# Patient Record
Sex: Female | Born: 1990 | Race: Asian | Hispanic: No | Marital: Married | Smoking: Never smoker
Health system: Southern US, Community
[De-identification: ages and names within clinical notes are randomized; demographics above are authoritative.]

## PROBLEM LIST (undated history)

## (undated) ENCOUNTER — Inpatient Hospital Stay (HOSPITAL_COMMUNITY): Payer: Self-pay

## (undated) DIAGNOSIS — O24419 Gestational diabetes mellitus in pregnancy, unspecified control: Secondary | ICD-10-CM

## (undated) DIAGNOSIS — N200 Calculus of kidney: Secondary | ICD-10-CM

## (undated) HISTORY — DX: Gestational diabetes mellitus in pregnancy, unspecified control: O24.419

## (undated) HISTORY — DX: Calculus of kidney: N20.0

## (undated) HISTORY — PX: NO PAST SURGERIES: SHX2092

---

## 2013-06-26 DIAGNOSIS — N200 Calculus of kidney: Secondary | ICD-10-CM

## 2013-06-26 HISTORY — DX: Calculus of kidney: N20.0

## 2015-05-03 LAB — OB RESULTS CONSOLE VARICELLA ZOSTER ANTIBODY, IGG: VARICELLA IGG: IMMUNE

## 2015-05-03 LAB — OB RESULTS CONSOLE GC/CHLAMYDIA
Chlamydia: NEGATIVE
Gonorrhea: NEGATIVE

## 2015-05-03 LAB — OB RESULTS CONSOLE HIV ANTIBODY (ROUTINE TESTING): HIV: NONREACTIVE

## 2015-05-03 LAB — OB RESULTS CONSOLE ABO/RH: RH TYPE: POSITIVE

## 2015-05-03 LAB — OB RESULTS CONSOLE RUBELLA ANTIBODY, IGM: Rubella: IMMUNE

## 2015-05-03 LAB — OB RESULTS CONSOLE HGB/HCT, BLOOD
HEMATOCRIT: 40 %
Hemoglobin: 12.5 g/dL

## 2015-05-03 LAB — OB RESULTS CONSOLE ANTIBODY SCREEN: ANTIBODY SCREEN: NEGATIVE

## 2015-05-03 LAB — OB RESULTS CONSOLE HEPATITIS B SURFACE ANTIGEN: Hepatitis B Surface Ag: NEGATIVE

## 2015-05-03 LAB — OB RESULTS CONSOLE RPR: RPR: NONREACTIVE

## 2015-08-13 ENCOUNTER — Encounter: Payer: Self-pay | Admitting: *Deleted

## 2015-08-13 DIAGNOSIS — N200 Calculus of kidney: Secondary | ICD-10-CM | POA: Insufficient documentation

## 2015-08-13 DIAGNOSIS — O0990 Supervision of high risk pregnancy, unspecified, unspecified trimester: Secondary | ICD-10-CM | POA: Insufficient documentation

## 2015-08-13 DIAGNOSIS — O24419 Gestational diabetes mellitus in pregnancy, unspecified control: Secondary | ICD-10-CM | POA: Insufficient documentation

## 2015-08-13 LAB — CYTOLOGY - PAP: PAP SMEAR: NEGATIVE

## 2015-08-16 ENCOUNTER — Encounter: Payer: Self-pay | Admitting: Obstetrics and Gynecology

## 2015-08-17 ENCOUNTER — Encounter (HOSPITAL_COMMUNITY): Payer: Self-pay

## 2015-08-17 ENCOUNTER — Other Ambulatory Visit (HOSPITAL_COMMUNITY): Payer: Self-pay

## 2015-09-06 ENCOUNTER — Encounter: Payer: Self-pay | Admitting: Family

## 2015-09-06 ENCOUNTER — Ambulatory Visit (INDEPENDENT_AMBULATORY_CARE_PROVIDER_SITE_OTHER): Payer: Medicaid Other | Admitting: Family

## 2015-09-06 ENCOUNTER — Encounter: Payer: Medicaid Other | Attending: Obstetrics and Gynecology | Admitting: *Deleted

## 2015-09-06 VITALS — BP 113/69 | HR 74 | Temp 98.5°F | Ht 60.5 in | Wt 148.0 lb

## 2015-09-06 DIAGNOSIS — O24419 Gestational diabetes mellitus in pregnancy, unspecified control: Secondary | ICD-10-CM

## 2015-09-06 DIAGNOSIS — Z029 Encounter for administrative examinations, unspecified: Secondary | ICD-10-CM | POA: Diagnosis not present

## 2015-09-06 DIAGNOSIS — Z23 Encounter for immunization: Secondary | ICD-10-CM

## 2015-09-06 DIAGNOSIS — O0993 Supervision of high risk pregnancy, unspecified, third trimester: Secondary | ICD-10-CM

## 2015-09-06 DIAGNOSIS — O24913 Unspecified diabetes mellitus in pregnancy, third trimester: Secondary | ICD-10-CM

## 2015-09-06 DIAGNOSIS — O24410 Gestational diabetes mellitus in pregnancy, diet controlled: Secondary | ICD-10-CM | POA: Diagnosis not present

## 2015-09-06 DIAGNOSIS — O09893 Supervision of other high risk pregnancies, third trimester: Secondary | ICD-10-CM

## 2015-09-06 LAB — POCT URINALYSIS DIP (DEVICE)
Bilirubin Urine: NEGATIVE
Glucose, UA: NEGATIVE mg/dL
HGB URINE DIPSTICK: NEGATIVE
KETONES UR: NEGATIVE mg/dL
Leukocytes, UA: NEGATIVE
Nitrite: NEGATIVE
PH: 7 (ref 5.0–8.0)
PROTEIN: NEGATIVE mg/dL
SPECIFIC GRAVITY, URINE: 1.02 (ref 1.005–1.030)
UROBILINOGEN UA: 0.2 mg/dL (ref 0.0–1.0)

## 2015-09-06 MED ORDER — GLUCOSE BLOOD VI STRP: ORAL_STRIP | Status: DC

## 2015-09-06 MED ORDER — ACCU-CHEK SOFTCLIX LANCET DEV MISC: Status: DC

## 2015-09-06 NOTE — Patient Instructions (Addendum)
WHOLE GRAIN   Third Trimester of Pregnancy The third trimester is from week 29 through week 42, months 7 through 9. The third trimester is a time when the fetus is growing rapidly. At the end of the ninth month, the fetus is about 20 inches in length and weighs 6-10 pounds.  BODY CHANGES Your body goes through many changes during pregnancy. The changes vary from woman to woman.   Your weight will continue to increase. You can expect to gain 25-35 pounds (11-16 kg) by the end of the pregnancy.  You may begin to get stretch marks on your hips, abdomen, and breasts.  You may urinate more often because the fetus is moving lower into your pelvis and pressing on your bladder.  You may develop or continue to have heartburn as a result of your pregnancy.  You may develop constipation because certain hormones are causing the muscles that push waste through your intestines to slow down.  You may develop hemorrhoids or swollen, bulging veins (varicose veins).  You may have pelvic pain because of the weight gain and pregnancy hormones relaxing your joints between the bones in your pelvis. Backaches may result from overexertion of the muscles supporting your posture.  You may have changes in your hair. These can include thickening of your hair, rapid growth, and changes in texture. Some women also have hair loss during or after pregnancy, or hair that feels dry or thin. Your hair will most likely return to normal after your baby is born.  Your breasts will continue to grow and be tender. A yellow discharge may leak from your breasts called colostrum.  Your belly button may stick out.  You may feel short of breath because of your expanding uterus.  You may notice the fetus "dropping," or moving lower in your abdomen.  You may have a bloody mucus discharge. This usually occurs a few days to a week before labor begins.  Your cervix becomes thin and soft (effaced) near your due date. WHAT TO EXPECT  AT YOUR PRENATAL EXAMS  You will have prenatal exams every 2 weeks until week 36. Then, you will have weekly prenatal exams. During a routine prenatal visit:  You will be weighed to make sure you and the fetus are growing normally.  Your blood pressure is taken.  Your abdomen will be measured to track your baby's growth.  The fetal heartbeat will be listened to.  Any test results from the previous visit will be discussed.  You may have a cervical check near your due date to see if you have effaced. At around 36 weeks, your caregiver will check your cervix. At the same time, your caregiver will also perform a test on the secretions of the vaginal tissue. This test is to determine if a type of bacteria, Group B streptococcus, is present. Your caregiver will explain this further. Your caregiver may ask you:  What your birth plan is.  How you are feeling.  If you are feeling the baby move.  If you have had any abnormal symptoms, such as leaking fluid, bleeding, severe headaches, or abdominal cramping.  If you are using any tobacco products, including cigarettes, chewing tobacco, and electronic cigarettes.  If you have any questions. Other tests or screenings that may be performed during your third trimester include:  Blood tests that check for low iron levels (anemia).  Fetal testing to check the health, activity level, and growth of the fetus. Testing is done if you have certain  medical conditions or if there are problems during the pregnancy.  HIV (human immunodeficiency virus) testing. If you are at high risk, you may be screened for HIV during your third trimester of pregnancy. FALSE LABOR You may feel small, irregular contractions that eventually go away. These are called Braxton Hicks contractions, or false labor. Contractions may last for hours, days, or even weeks before true labor sets in. If contractions come at regular intervals, intensify, or become painful, it is best to  be seen by your caregiver.  SIGNS OF LABOR   Menstrual-like cramps.  Contractions that are 5 minutes apart or less.  Contractions that start on the top of the uterus and spread down to the lower abdomen and back.  A sense of increased pelvic pressure or back pain.  A watery or bloody mucus discharge that comes from the vagina. If you have any of these signs before the 37th week of pregnancy, call your caregiver right away. You need to go to the hospital to get checked immediately. HOME CARE INSTRUCTIONS   Avoid all smoking, herbs, alcohol, and unprescribed drugs. These chemicals affect the formation and growth of the baby.  Do not use any tobacco products, including cigarettes, chewing tobacco, and electronic cigarettes. If you need help quitting, ask your health care provider. You may receive counseling support and other resources to help you quit.  Follow your caregiver's instructions regarding medicine use. There are medicines that are either safe or unsafe to take during pregnancy.  Exercise only as directed by your caregiver. Experiencing uterine cramps is a good sign to stop exercising.  Continue to eat regular, healthy meals.  Wear a good support bra for breast tenderness.  Do not use hot tubs, steam rooms, or saunas.  Wear your seat belt at all times when driving.  Avoid raw meat, uncooked cheese, cat litter boxes, and soil used by cats. These carry germs that can cause birth defects in the baby.  Take your prenatal vitamins.  Take 1500-2000 mg of calcium daily starting at the 20th week of pregnancy until you deliver your baby.  Try taking a stool softener (if your caregiver approves) if you develop constipation. Eat more high-fiber foods, such as fresh vegetables or fruit and whole grains. Drink plenty of fluids to keep your urine clear or pale yellow.  Take warm sitz baths to soothe any pain or discomfort caused by hemorrhoids. Use hemorrhoid cream if your caregiver  approves.  If you develop varicose veins, wear support hose. Elevate your feet for 15 minutes, 3-4 times a day. Limit salt in your diet.  Avoid heavy lifting, wear low heal shoes, and practice good posture.  Rest a lot with your legs elevated if you have leg cramps or low back pain.  Visit your dentist if you have not gone during your pregnancy. Use a soft toothbrush to brush your teeth and be gentle when you floss.  A sexual relationship may be continued unless your caregiver directs you otherwise.  Do not travel far distances unless it is absolutely necessary and only with the approval of your caregiver.  Take prenatal classes to understand, practice, and ask questions about the labor and delivery.  Make a trial run to the hospital.  Pack your hospital bag.  Prepare the baby's nursery.  Continue to go to all your prenatal visits as directed by your caregiver. SEEK MEDICAL CARE IF:  You are unsure if you are in labor or if your water has broken.  You have  dizziness.  You have mild pelvic cramps, pelvic pressure, or nagging pain in your abdominal area.  You have persistent nausea, vomiting, or diarrhea.  You have a bad smelling vaginal discharge.  You have pain with urination. SEEK IMMEDIATE MEDICAL CARE IF:   You have a fever.  You are leaking fluid from your vagina.  You have spotting or bleeding from your vagina.  You have severe abdominal cramping or pain.  You have rapid weight loss or gain.  You have shortness of breath with chest pain.  You notice sudden or extreme swelling of your face, hands, ankles, feet, or legs.  You have not felt your baby move in over an hour.  You have severe headaches that do not go away with medicine.  You have vision changes.   This information is not intended to replace advice given to you by your health care provider. Make sure you discuss any questions you have with your health care provider.   Document Released:  06/06/2001 Document Revised: 07/03/2014 Document Reviewed: 08/13/2012 Elsevier Interactive Patient Education [employer] Elsevier Inc.

## 2015-09-06 NOTE — Progress Notes (Signed)
U/S scheduled for 03/20 @ 8am

## 2015-09-06 NOTE — Progress Notes (Signed)
   Patient was seen on 09/06/15 for Gestational Diabetes self-management . The following learning objectives were met by the patient :   States the definition of Gestational Diabetes  States when to check blood glucose levels  Demonstrates proper blood glucose monitoring techniques  States the effect of stress and exercise on blood glucose levels  States the importance of limiting caffeine and abstaining from alcohol and smoking  Plan:  Consider  increasing your activity level by walking daily as tolerated Begin checking BG before breakfast and 2 hours after first bit of breakfast, lunch and dinner after  as directed by MD  Take medication  as directed by MD  Blood glucose monitor given: Accu-chek Aviva Plus Lot # [id number] Exp: 06-26-15  Patient instructed to monitor glucose levels: FBS: 60 - <90 2 hour: <120  Patient received the following handouts:  Nutrition Diabetes and Pregnancy  Patient will be seen for follow-up in one week.

## 2015-09-06 NOTE — Progress Notes (Signed)
Nutrition note: 1st visit consult & GDM diet education Pt was recently diagnosed with GDM.  Pt has gained 15# @ 31w, which is wnl. Pt reports eating 3-5x/d. Pt is taking a PNV. Pt reports no N&V or heartburn. NKFA. Pt reports no walking or physical activity. Pt received verbal & written education on GDM in [place] via a video-interpreter. Encouraged ~30 mins of walking most days of the week. Discussed wt gain goals of 15-25# or 0.6#/wk. Pt agrees to follow GDM diet with 3 meals & 1-3 snacks/d with proper CHO/ protein combination. Pt has WIC & plans to BF. F/u in 2-4 wks Blondell RevealLaura Shams Fill, MS, RD, LDN, [facility]

## 2015-09-06 NOTE — Progress Notes (Signed)
    Subjective:    Kristi [name] is a G1P0000 5231w0d being seen today for her first obstetrical visit.  Pt is a transfer from the health department.    Her obstetrical history is significant for gestational diabetes. Patient does intend to breast feed. Pregnancy history fully reviewed.  Patient reports no complaints.  Filed Vitals:   09/06/15 1002 09/06/15 1014  BP: 113/69   Pulse: 74   Temp: 98.5 F (36.9 C)   Height:  5\' 2"  (1.575 m)  Weight: 148 lb (67.132 kg)     HISTORY: OB History  Gravida Para Term Preterm AB SAB TAB Ectopic Multiple Living  1 0 0 0 0 0 0 0 0 0     # Outcome Date GA Lbr Len/2nd Weight Sex Delivery Anes PTL Lv  1 Current              Past Medical History  Diagnosis Date  . Kidney stones 2015  . Gestational diabetes    Past Surgical History  Procedure Laterality Date  . No past surgeries     Family History  Problem Relation Age of Onset  . Heart disease Mother   . Diabetes Mother   . Cancer Mother     stomach  . Hypertension Father      Exam    Uterus:  Fundal Height: 32 cm   Filed Vitals:   09/06/15 1002 09/06/15 1014  BP: 113/69   Pulse: 74   Temp: 98.5 F (36.9 C)   Height:  5\' 2"  (1.575 m)  Weight: 148 lb (67.132 kg)     Fetal Status: Fetal Heart Rate (bpm): 135 Fundal Height: 32 cm Movement: Present     General:  Alert, oriented and cooperative. Patient is in no acute distress.  Skin: Skin is warm and dry. No rash noted.   Cardiovascular: Normal heart rate noted  Respiratory: Normal respiratory effort, no problems with respiration noted  Abdomen: Soft, gravid, appropriate for gestational age. Pain/Pressure: Absent     Pelvic:       Cervical exam deferred        Extremities: Normal range of motion.  Edema: None  Mental Status: Normal mood and affect. Normal behavior. Normal judgment and thought content.   Urinalysis:    Protein neg Glucose neg     Assessment:    Pregnancy: G1P0000 Patient Active Problem List   Diagnosis Date Noted  . Supervision of high risk pregnancy, antepartum 08/13/2015  . Gestational diabetes 08/13/2015  . Kidney stones         Plan:     Reviewed prenatal records. Prenatal vitamins. Problem list reviewed and updated. Discussed implications of DM in pregnancy, need for optimizing glycemic control to decrease DM associated maternal-fetal morbidity and mortality, need for antenatal testing and frequent ultrasounds/prenatal visits. Will check baseline labs today. DM education and DM testing supplies today. Genetic Screening:  Quad negative  Ultrasound discussed; fetal survey: ordered for growth.  Follow up in 2 weeks. Video interpreter:  Phylliss Bobruyen #[id number]  Marlis EdelsonKARIM, WALIDAH N 09/06/2015

## 2015-09-07 ENCOUNTER — Telehealth: Payer: Self-pay | Admitting: *Deleted

## 2015-09-07 NOTE — Telephone Encounter (Signed)
[facility] requires a prior authorization for accu-check aviva plus test strips. Per chart review has medicaid and GDM.    Called [facility] twice to speak with pharmacist- but did not get an answer- kept getting bumped back to triage line.

## 2015-09-07 NOTE — Telephone Encounter (Signed)
Kristi [name] called this afternoon and left a message [facility] and she got the machine and she has checked it many times and it is not working.   I called [employer] and she states it is still not working , I asked her to come to clinic tomorrow so we can check it with her. She states she has an appointment Monday. I informed her we do not want her to wait until Monday- we want her to be able to check her sugars. She states she is working but can come at [employer]3pm.

## 2015-09-08 ENCOUNTER — Ambulatory Visit: Payer: Medicaid Other | Admitting: *Deleted

## 2015-09-08 DIAGNOSIS — O24419 Gestational diabetes mellitus in pregnancy, unspecified control: Secondary | ICD-10-CM

## 2015-09-08 LAB — CULTURE, OB URINE
Colony Count: NO GROWTH
Organism ID, Bacteria: NO GROWTH

## 2015-09-08 NOTE — Telephone Encounter (Signed)
Pt here today, 09/08/15, concern addressed.

## 2015-09-08 NOTE — Progress Notes (Signed)
Kristi [name] came in for help with her glucose meter. I had spoke with her yesterday and asked her to come in to see if we could help her. She states since she left Monday she has tried at least 10 times but meter not working.  I had her check her blood sugar and I helped her collect the blood with her finger- she states " oh, I was trying to touch it from the top".CBG was 92,  I had to repeat collecting her blood and on the second time she did it correctly and states she understands to make sure she has a good drop of blood and touch the blood drop to the end of the strip. She also reviewed when and how often she should be collecting her blood (fasting and 2 hours after all meals).  We recorded her result in her logbook and she states she understands to continue at home, bring book and meter Monday to her next appt. She was worried about missing some results, I explained she could tell the doctor about the meter problems.

## 2015-09-10 ENCOUNTER — Encounter (HOSPITAL_COMMUNITY): Payer: Self-pay

## 2015-09-13 ENCOUNTER — Other Ambulatory Visit: Payer: Self-pay | Admitting: Family

## 2015-09-13 ENCOUNTER — Encounter: Payer: Medicaid Other | Admitting: *Deleted

## 2015-09-13 ENCOUNTER — Encounter (HOSPITAL_COMMUNITY): Payer: Self-pay

## 2015-09-13 ENCOUNTER — Ambulatory Visit: Payer: Medicaid Other | Admitting: *Deleted

## 2015-09-13 ENCOUNTER — Ambulatory Visit (HOSPITAL_COMMUNITY)
Admission: RE | Admit: 2015-09-13 | Discharge: 2015-09-13 | Disposition: A | Discharge status: Home / Self Care | Payer: Medicaid Other | Source: Ambulatory Visit | Attending: Family | Admitting: Family

## 2015-09-13 VITALS — BP 107/74 | HR 84 | Wt 149.6 lb

## 2015-09-13 DIAGNOSIS — O24419 Gestational diabetes mellitus in pregnancy, unspecified control: Secondary | ICD-10-CM

## 2015-09-13 DIAGNOSIS — O24410 Gestational diabetes mellitus in pregnancy, diet controlled: Secondary | ICD-10-CM | POA: Insufficient documentation

## 2015-09-13 DIAGNOSIS — Z3A32 32 weeks gestation of pregnancy: Secondary | ICD-10-CM | POA: Diagnosis not present

## 2015-09-13 DIAGNOSIS — Z3689 Encounter for other specified antenatal screening: Secondary | ICD-10-CM

## 2015-09-13 DIAGNOSIS — O0990 Supervision of high risk pregnancy, unspecified, unspecified trimester: Secondary | ICD-10-CM

## 2015-09-13 DIAGNOSIS — Z36 Encounter for antenatal screening of mother: Secondary | ICD-10-CM | POA: Insufficient documentation

## 2015-09-13 DIAGNOSIS — Z029 Encounter for administrative examinations, unspecified: Secondary | ICD-10-CM | POA: Diagnosis not present

## 2015-09-13 NOTE — Progress Notes (Signed)
Pt presents for review of glucose readings. FBS 80-92, whpp breakfast 79-94, 2hpp lunch 81-93, 2hpp dinner 80-119. Number validated in meter. Readings WNL no modification needed. Patient will be seen by MD in 1 week.

## 2015-09-14 ENCOUNTER — Other Ambulatory Visit (HOSPITAL_COMMUNITY): Payer: Self-pay

## 2015-09-20 ENCOUNTER — Ambulatory Visit (INDEPENDENT_AMBULATORY_CARE_PROVIDER_SITE_OTHER): Payer: Medicaid Other | Admitting: Obstetrics & Gynecology

## 2015-09-20 VITALS — BP 117/59 | HR 78 | Temp 97.7°F | Wt 148.2 lb

## 2015-09-20 DIAGNOSIS — O24410 Gestational diabetes mellitus in pregnancy, diet controlled: Secondary | ICD-10-CM | POA: Diagnosis present

## 2015-09-20 DIAGNOSIS — O0993 Supervision of high risk pregnancy, unspecified, third trimester: Secondary | ICD-10-CM

## 2015-09-20 LAB — POCT URINALYSIS DIP (DEVICE)
BILIRUBIN URINE: NEGATIVE
Bilirubin Urine: NEGATIVE
GLUCOSE, UA: NEGATIVE mg/dL
Glucose, UA: NEGATIVE mg/dL
HGB URINE DIPSTICK: NEGATIVE
Hgb urine dipstick: NEGATIVE
KETONES UR: NEGATIVE mg/dL
KETONES UR: NEGATIVE mg/dL
Nitrite: NEGATIVE
Nitrite: NEGATIVE
PH: 6 (ref 5.0–8.0)
PROTEIN: NEGATIVE mg/dL
PROTEIN: NEGATIVE mg/dL
Urobilinogen, UA: 0.2 mg/dL (ref 0.0–1.0)
Urobilinogen, UA: 0.2 mg/dL (ref 0.0–1.0)
pH: 6 (ref 5.0–8.0)

## 2015-09-20 LAB — CBC
HEMATOCRIT: 37.1 % (ref 36.0–46.0)
HEMOGLOBIN: 11.8 g/dL — AB (ref 12.0–15.0)
MCH: 21.3 pg — ABNORMAL LOW (ref 26.0–34.0)
MCHC: 31.8 g/dL (ref 30.0–36.0)
MCV: 67.1 fL — AB (ref 78.0–100.0)
Platelets: 189 10*3/uL (ref 150–400)
RBC: 5.53 MIL/uL — ABNORMAL HIGH (ref 3.87–5.11)
RDW: 15.4 % (ref 11.5–15.5)
WBC: 8.9 10*3/uL (ref 4.0–10.5)

## 2015-09-20 LAB — HIV ANTIBODY (ROUTINE TESTING W REFLEX): HIV 1&2 Ab, 4th Generation: NONREACTIVE

## 2015-09-20 NOTE — Patient Instructions (Signed)

## 2015-09-20 NOTE — Progress Notes (Signed)
Breastfeeding tip of the week reviewed 28 week labs today

## 2015-09-20 NOTE — Progress Notes (Signed)
Subjective:  Kristi [name] is a 25 y.o. G1P0000 at 4970w0d being seen today for ongoing prenatal care.  She is currently monitored for the following issues for this high-risk pregnancy and has Kidney stones; Supervision of high risk pregnancy, antepartum; and Gestational diabetes on her problem list.  Patient reports rash and itch on abdomen.  Contractions: Not present. Vag. Bleeding: None.  Movement: Present. Denies leaking of fluid.   The following portions of the patient's history were reviewed and updated as appropriate: allergies, current medications, past family history, past medical history, past social history, past surgical history and problem list. Problem list updated.  Objective:   Filed Vitals:   09/20/15 0952  BP: 117/59  Pulse: 78  Temp: 97.7 F (36.5 C)  Weight: 67.223 kg (148 lb 3.2 oz)    Fetal Status: Fetal Heart Rate (bpm): 133   Movement: Present     General:  Alert, oriented and cooperative. Patient is in no acute distress.  Skin: Skin is warm and dry. No rash noted.   Cardiovascular: Normal heart rate noted  Respiratory: Normal respiratory effort, no problems with respiration noted  Abdomen: Soft, gravid, appropriate for gestational age. Pain/Pressure: Present     Pelvic: Vag. Bleeding: None Vag D/C Character: White   Cervical exam deferred        Extremities: Normal range of motion.  Edema: None  Mental Status: Normal mood and affect. Normal behavior. Normal judgment and thought content.   Urinalysis:      Assessment and Plan:  Pregnancy: G1P0000 at 470w0d  1. Supervision of high risk pregnancy, antepartum, third trimester PUPPP with rash on abdomen, may use benadryl - CBC - HIV antibody (with reflex) - RPR  2. Diet controlled gestational diabetes mellitus in third trimester Excellent diet control. Nl anatomy scan 1 week ago  Preterm labor symptoms and general obstetric precautions including but not limited to vaginal bleeding, contractions, leaking of  fluid and fetal movement were reviewed in detail with the patient. Please refer to After Visit Summary for other counseling recommendations.  F/u 2 weeks  Adam PhenixJames G Mahmood Boehringer, MD

## 2015-09-21 LAB — RPR

## 2015-10-04 ENCOUNTER — Ambulatory Visit (INDEPENDENT_AMBULATORY_CARE_PROVIDER_SITE_OTHER): Payer: Medicaid Other | Admitting: Obstetrics & Gynecology

## 2015-10-04 VITALS — BP 111/65 | HR 66 | Wt 151.2 lb

## 2015-10-04 DIAGNOSIS — O0993 Supervision of high risk pregnancy, unspecified, third trimester: Secondary | ICD-10-CM

## 2015-10-04 DIAGNOSIS — O24410 Gestational diabetes mellitus in pregnancy, diet controlled: Secondary | ICD-10-CM | POA: Diagnosis not present

## 2015-10-04 LAB — POCT URINALYSIS DIP (DEVICE)
Bilirubin Urine: NEGATIVE
GLUCOSE, UA: NEGATIVE mg/dL
Ketones, ur: NEGATIVE mg/dL
Nitrite: NEGATIVE
PH: 7 (ref 5.0–8.0)
Protein, ur: NEGATIVE mg/dL
SPECIFIC GRAVITY, URINE: 1.02 (ref 1.005–1.030)
UROBILINOGEN UA: 0.2 mg/dL (ref 0.0–1.0)

## 2015-10-04 MED ORDER — ACCU-CHEK SOFTCLIX LANCET DEV MISC: Status: DC

## 2015-10-04 MED ORDER — GLUCOSE BLOOD VI STRP: ORAL_STRIP | Status: DC

## 2015-10-04 NOTE — Progress Notes (Signed)
Used interpreter number [id number]. Pt out of blood testing supplies.

## 2015-10-04 NOTE — Patient Instructions (Signed)
Return to clinic for any scheduled appointments or obstetric concerns, or go to [facility] for evaluation  

## 2015-10-04 NOTE — Progress Notes (Signed)
Subjective:  Kristi [name] is a 25 y.o. G1P0000 at 7078w0d being seen today for ongoing prenatal care.  She is currently monitored for the following issues for this high-risk pregnancy and has Kidney stones; Supervision of high risk pregnancy, antepartum; and Gestational diabetes on her problem list. [employer] phone interpreter used.  Patient reports no complaints.  Contractions: Not present. Vag. Bleeding: None.  Movement: Present. Denies leaking of fluid.   The following portions of the patient's history were reviewed and updated as appropriate: allergies, current medications, past family history, past medical history, past social history, past surgical history and problem list. Problem list updated.  Objective:   Filed Vitals:   10/04/15 0945  BP: 111/65  Pulse: 66  Weight: 151 lb 3.2 oz (68.584 kg)    Fetal Status: Fetal Heart Rate (bpm): 135 Fundal Height: 35 cm Movement: Present     General:  Alert, oriented and cooperative. Patient is in no acute distress.  Skin: Skin is warm and dry. No rash noted.   Cardiovascular: Normal heart rate noted  Respiratory: Normal respiratory effort, no problems with respiration noted  Abdomen: Soft, gravid, appropriate for gestational age. Pain/Pressure: Absent     Pelvic: Vag. Bleeding: None    Cervical exam deferred        Extremities: Normal range of motion.  Edema: None  Mental Status: Normal mood and affect. Normal behavior. Normal judgment and thought content.   Urinalysis: Urine Protein: Negative Urine Glucose: Negative CBGs within control Assessment and Plan:  Pregnancy: G1P0000 at 3178w0d  1. Diet controlled gestational diabetes mellitus in third trimester Continue diet control. Lancets and strips refilled. 38 week growth scan ordered. - glucose blood (ACCU-CHEK AVIVA PLUS) test strip; New DX GDM O24.419 for testing 4 times daily  Dispense: 100 each; Refill: 12 - Lancet Devices (ACCU-CHEK SOFTCLIX) lancets; New DX GDM O24.419 for testing 4  times daily  Dispense: 100 each; Refill: 10 - US MFM OB FOLLOW UP; Future  2. Supervision of high risk pregnancy, antepartum, third trimester Preterm labor symptoms and general obstetric precautions including but not limited to vaginal bleeding, contractions, leaking of fluid and fetal movement were reviewed in detail with the patient. Please refer to After Visit Summary for other counseling recommendations.  Return in about 1 week (around 10/11/2015) for OB Visit, Pelvic cultures.   Tereso NewcomerUgonna A Bethany Cumming, MD

## 2015-10-11 ENCOUNTER — Other Ambulatory Visit (HOSPITAL_COMMUNITY)
Admission: RE | Admit: 2015-10-11 | Discharge: 2015-10-11 | Disposition: A | Discharge status: Home / Self Care | Payer: Medicaid Other | Source: Ambulatory Visit | Attending: Obstetrics and Gynecology | Admitting: Obstetrics and Gynecology

## 2015-10-11 ENCOUNTER — Ambulatory Visit (INDEPENDENT_AMBULATORY_CARE_PROVIDER_SITE_OTHER): Payer: Medicaid Other | Admitting: Obstetrics and Gynecology

## 2015-10-11 ENCOUNTER — Encounter: Payer: Self-pay | Admitting: Obstetrics and Gynecology

## 2015-10-11 VITALS — BP 115/53 | HR 92 | Temp 97.6°F | Wt 152.3 lb

## 2015-10-11 DIAGNOSIS — Z113 Encounter for screening for infections with a predominantly sexual mode of transmission: Secondary | ICD-10-CM | POA: Insufficient documentation

## 2015-10-11 DIAGNOSIS — O24410 Gestational diabetes mellitus in pregnancy, diet controlled: Secondary | ICD-10-CM | POA: Diagnosis not present

## 2015-10-11 DIAGNOSIS — O0993 Supervision of high risk pregnancy, unspecified, third trimester: Secondary | ICD-10-CM

## 2015-10-11 LAB — POCT URINALYSIS DIP (DEVICE)
Bilirubin Urine: NEGATIVE
Glucose, UA: NEGATIVE mg/dL
HGB URINE DIPSTICK: NEGATIVE
Ketones, ur: NEGATIVE mg/dL
NITRITE: NEGATIVE
PROTEIN: NEGATIVE mg/dL
SPECIFIC GRAVITY, URINE: 1.02 (ref 1.005–1.030)
UROBILINOGEN UA: 1 mg/dL (ref 0.0–1.0)
pH: 7 (ref 5.0–8.0)

## 2015-10-11 LAB — OB RESULTS CONSOLE GBS: GBS: NEGATIVE

## 2015-10-11 NOTE — Progress Notes (Signed)
Subjective:  Kristi [name] is a 25 y.o. G1P0000 at 1625w0d being seDaivd Councilen today for ongoing prenatal care.  She is currently monitored for the following issues for this high-risk pregnancy and has Kidney stones; Supervision of high risk pregnancy, antepartum; and Gestational diabetes on her problem list.  Patient reports no complaints.  Contractions: Irregular. Vag. Bleeding: None.  Movement: Present. Denies leaking of fluid.   The following portions of the patient's history were reviewed and updated as appropriate: allergies, current medications, past family history, past medical history, past social history, past surgical history and problem list. Problem list updated.  Objective:   Filed Vitals:   10/11/15 1018  BP: 115/53  Pulse: 92  Temp: 97.6 F (36.4 C)  Weight: 152 lb 4.8 oz (69.083 kg)    Fetal Status: Fetal Heart Rate (bpm): 152   Movement: Present     General:  Alert, oriented and cooperative. Patient is in no acute distress.  Skin: Skin is warm and dry. No rash noted.   Cardiovascular: Normal heart rate noted  Respiratory: Normal respiratory effort, no problems with respiration noted  Abdomen: Soft, gravid, appropriate for gestational age. Pain/Pressure: Present     Pelvic: Vag. Bleeding: None Vag D/C Character: White   Cervical exam performed        Extremities: Normal range of motion.  Edema: Trace  Mental Status: Normal mood and affect. Normal behavior. Normal judgment and thought content.   Urinalysis: Urine Protein: Negative Urine Glucose: Negative  Assessment and Plan:  Pregnancy: G1P0000 at 6225w0d  1. Supervision of high risk pregnancy, antepartum, third trimester Patient is doing well without complaints - Culture, beta strep (group b only) - [facility]Chlamydia [facility]  2. Diet controlled gestational diabetes mellitus in third trimester CBGs reviewed and all within range with the exception of one fasting value of 101 I congratulated the  patient and encouraged her to continue her great efforts  Preterm labor symptoms and general obstetric precautions including but not limited to vaginal bleeding, contractions, leaking of fluid and fetal movement were reviewed in detail with the patient. Please refer to After Visit Summary for other counseling recommendations.  Return in about 1 week (around 10/18/2015).   Catalina AntiguaPeggy Eilah Common, MD

## 2015-10-11 NOTE — Progress Notes (Signed)
Video Interpreter [phone]

## 2015-10-12 LAB — GC/CHLAMYDIA PROBE AMP (~~LOC~~) NOT AT ARMC
CHLAMYDIA, DNA PROBE: NEGATIVE
Neisseria Gonorrhea: NEGATIVE

## 2015-10-14 LAB — CULTURE, BETA STREP (GROUP B ONLY)

## 2015-10-18 ENCOUNTER — Ambulatory Visit (INDEPENDENT_AMBULATORY_CARE_PROVIDER_SITE_OTHER): Payer: Medicaid Other | Admitting: Obstetrics & Gynecology

## 2015-10-18 VITALS — BP 103/69 | HR 74 | Wt 151.6 lb

## 2015-10-18 DIAGNOSIS — O24410 Gestational diabetes mellitus in pregnancy, diet controlled: Secondary | ICD-10-CM

## 2015-10-18 DIAGNOSIS — O0993 Supervision of high risk pregnancy, unspecified, third trimester: Secondary | ICD-10-CM

## 2015-10-18 LAB — POCT URINALYSIS DIP (DEVICE)
BILIRUBIN URINE: NEGATIVE
GLUCOSE, UA: NEGATIVE mg/dL
Hgb urine dipstick: NEGATIVE
KETONES UR: NEGATIVE mg/dL
Nitrite: NEGATIVE
PROTEIN: NEGATIVE mg/dL
SPECIFIC GRAVITY, URINE: 1.01 (ref 1.005–1.030)
Urobilinogen, UA: 0.2 mg/dL (ref 0.0–1.0)
pH: 7 (ref 5.0–8.0)

## 2015-10-18 NOTE — Progress Notes (Signed)
Breastfeeding tip of the week reviewed. 

## 2015-10-18 NOTE — Progress Notes (Signed)
Subjective: occas UC  Kristi [name] is a 25 y.o. G1P0000 at 3167w0d being seen today for ongoing prenatal care.  She is currently monitored for the following issues for this high-risk pregnancy and has Kidney stones; Supervision of high risk pregnancy, antepartum; and Gestational diabetes on her problem list.  Patient reports occasional contractions.  Contractions: Irregular. Vag. Bleeding: None.  Movement: Present. Denies leaking of fluid.   The following portions of the patient's history were reviewed and updated as appropriate: allergies, current medications, past family history, past medical history, past social history, past surgical history and problem list. Problem list updated.  Objective:   Filed Vitals:   10/18/15 1048  BP: 103/69  Pulse: 74  Weight: 151 lb 9.6 oz (68.765 kg)    Fetal Status: Fetal Heart Rate (bpm): 145   Movement: Present     General:  Alert, oriented and cooperative. Patient is in no acute distress.  Skin: Skin is warm and dry. No rash noted.   Cardiovascular: Normal heart rate noted  Respiratory: Normal respiratory effort, no problems with respiration noted  Abdomen: Soft, gravid, appropriate for gestational age. Pain/Pressure: Present     Pelvic: Vag. Bleeding: None     Cervical exam deferred        Extremities: Normal range of motion.  Edema: None  Mental Status: Normal mood and affect. Normal behavior. Normal judgment and thought content.   Urinalysis:      Assessment and Plan:  Pregnancy: G1P0000 at 10867w0d  1. Supervision of high risk pregnancy, antepartum, third trimester BG almost all in range, continue diet control  2. Diet controlled gestational diabetes mellitus in third trimester   Term labor symptoms and general obstetric precautions including but not limited to vaginal bleeding, contractions, leaking of fluid and fetal movement were reviewed in detail with the patient. Please refer to After Visit Summary for other counseling  recommendations.  Return in about 1 week (around 10/25/2015).   Adam PhenixJames G Yasenia Reedy, MD

## 2015-10-18 NOTE — Patient Instructions (Signed)
Gestational Diabetes Mellitus  Gestational diabetes mellitus, often simply referred to as gestational diabetes, is a type of diabetes that some women develop during pregnancy. In gestational diabetes, the pancreas does not make enough insulin (a hormone), the cells are less responsive to the insulin that is made (insulin resistance), or both. Normally, insulin moves sugars from food into the tissue cells. The tissue cells use the sugars for energy. The lack of insulin or the lack of normal response to insulin causes excess sugars to build up in the blood instead of going into the tissue cells. As a result, high blood sugar (hyperglycemia) develops. The effect of high sugar (glucose) levels can cause many problems.   RISK FACTORS  You have an increased chance of developing gestational diabetes if you have a family history of diabetes and also have one or more of the following risk factors:  · A body mass index over 30 (obesity).  · A previous pregnancy with gestational diabetes.  · An older age at the time of pregnancy.  If blood glucose levels are kept in the normal range during pregnancy, women can have a healthy pregnancy. If your blood glucose levels are not well controlled, there may be risks to you, your unborn baby (fetus), your labor and delivery, or your newborn baby.   SYMPTOMS   If symptoms are experienced, they are much like symptoms you would normally expect during pregnancy. The symptoms of gestational diabetes include:   · Increased thirst (polydipsia).  · Increased urination (polyuria).  · Increased urination during the night (nocturia).  · Weight loss. This weight loss may be rapid.  · Frequent, recurring infections.  · Tiredness (fatigue).  · Weakness.  · Vision changes, such as blurred vision.  · Fruity smell to your breath.  · Abdominal pain.  DIAGNOSIS  Diabetes is diagnosed when blood glucose levels are increased. Your blood glucose level may be checked by one or more of the following blood  tests:  · A fasting blood glucose test. You will not be allowed to eat for at least 8 hours before a blood sample is taken.  · A random blood glucose test. Your blood glucose is checked at any time of the day regardless of when you ate.  · An oral glucose tolerance test (OGTT). Your blood glucose is measured after you have not eaten (fasted) for 1-3 hours and then after you drink a glucose-containing beverage. Since the hormones that cause insulin resistance are highest at about 24-28 weeks of a pregnancy, an OGTT is usually performed during that time. If you have risk factors, you may be screened for undiagnosed type 2 diabetes at your first prenatal visit.  TREATMENT   Gestational diabetes should be managed first with diet and exercise. Medicines may be added only if they are needed.  · You will need to take diabetes medicine or insulin daily to keep blood glucose levels in the desired range.  · You will need to match insulin dosing with exercise and healthy food choices.  If you have gestational diabetes, your treatment goal is to maintain the following blood glucose levels:  · Before meals (preprandial): at or below 95 mg/dL.  · After meals (postprandial):    One hour after a meal: at or below 140 mg/dL.    Two hours after a meal: at or below 120 mg/dL.  If you have pre-existing type 1 or type 2 diabetes, your treatment goal is to maintain the following blood glucose levels:  · Before   meals, at bedtime, and overnight: 60-99 mg/dL.  · After meals: peak of 100-129 mg/dL.  HOME CARE INSTRUCTIONS   · Have your hemoglobin A1c level checked twice a year.  · Perform daily blood glucose monitoring as directed by your health care provider. It is common to perform frequent blood glucose monitoring.  · Monitor urine ketones when you are ill and as directed by your health care provider.  · Take your diabetes medicine and insulin as directed by your health care provider to maintain your blood glucose level in the desired  range.  ¨ Never run out of diabetes medicine or insulin. It is needed every day.  ¨ Adjust insulin based on your intake of carbohydrates. Carbohydrates can raise blood glucose levels but need to be included in your diet. Carbohydrates provide vitamins, minerals, and fiber which are an essential part of a healthy diet. Carbohydrates are found in fruits, vegetables, whole grains, dairy products, legumes, and foods containing added sugars.  · Eat healthy foods. Alternate 3 meals with 3 snacks.  · Maintain a healthy weight gain. The usual total expected weight gain varies according to your prepregnancy body mass index (BMI).  · Carry a medical alert card or wear your medical alert jewelry.  · Carry a 15-gram carbohydrate snack with you at all times to treat low blood glucose (hypoglycemia). Some examples of 15-gram carbohydrate snacks include:  ¨ Glucose tablets, 3 or 4.  ¨ Glucose gel, 15-gram tube.  ¨ Raisins, 2 tablespoons (24 g).  ¨ Jelly beans, 6.  ¨ Animal crackers, 8.  ¨ Fruit juice, regular soda, or low-fat milk, 4 ounces (120 mL).  ¨ Gummy treats, 9.  · Recognize hypoglycemia. Hypoglycemia during pregnancy occurs with blood glucose levels of 60 mg/dL and below. The risk for hypoglycemia increases when fasting or skipping meals, during or after intense exercise, and during sleep. Hypoglycemia symptoms can include:  ¨ Tremors or shakes.  ¨ Decreased ability to concentrate.  ¨ Sweating.  ¨ Increased heart rate.  ¨ Headache.  ¨ Dry mouth.  ¨ Hunger.  ¨ Irritability.  ¨ Anxiety.  ¨ Restless sleep.  ¨ Altered speech or coordination.  ¨ Confusion.  · Treat hypoglycemia promptly. If you are alert and able to safely swallow, follow the 15:15 rule:  ¨ Take 15-20 grams of rapid-acting glucose or carbohydrate. Rapid-acting options include glucose gel, glucose tablets, or 4 ounces (120 mL) of fruit juice, regular soda, or low-fat milk.  ¨ Check your blood glucose level 15 minutes after taking the glucose.  ¨ Take 15-20  grams more of glucose if the repeat blood glucose level is still 70 mg/dL or below.  ¨ Eat a meal or snack within 1 hour once blood glucose levels return to normal.  · Be alert to polyuria (excess urination) and polydipsia (excess thirst) which are early signs of hyperglycemia. An early awareness of hyperglycemia allows for prompt treatment. Treat hyperglycemia as directed by your health care provider.  · Engage in at least 30 minutes of physical activity a day or as directed by your health care provider. Ten minutes of physical activity timed 30 minutes after each meal is encouraged to control postprandial blood glucose levels.  · Adjust your insulin dosing and food intake as needed if you start a new exercise or sport.  · Follow your sick-day plan at any time you are unable to eat or drink as usual.  · Avoid tobacco and alcohol use.  · Keep all follow-up visits as directed   by your health care provider.  · Follow the advice of your health care provider regarding your prenatal and post-delivery (postpartum) appointments, meal planning, exercise, medicines, vitamins, blood tests, other medical tests, and physical activities.  · Perform daily skin and foot care. Examine your skin and feet daily for cuts, bruises, redness, nail problems, bleeding, blisters, or sores.  · Brush your teeth and gums at least twice a day and floss at least once a day. Follow up with your dentist regularly.  · Schedule an eye exam during the first trimester of your pregnancy or as directed by your health care provider.  · Share your diabetes management plan with your workplace or school.  · Stay up-to-date with immunizations.  · Learn to manage stress.  · Obtain ongoing diabetes education and support as needed.  · Learn about and consider breastfeeding your baby.  · You should have your blood sugar level checked 6-12 weeks after delivery. This is done with an oral glucose tolerance test (OGTT).  SEEK MEDICAL CARE IF:   · You are unable to  eat food or drink fluids for more than 6 hours.  · You have nausea and vomiting for more than 6 hours.  · You have a blood glucose level of 200 mg/dL and you have ketones in your urine.  · There is a change in mental status.  · You develop vision problems.  · You have a persistent headache.  · You have upper abdominal pain or discomfort.  · You develop an additional serious illness.  · You have diarrhea for more than 6 hours.  · You have been sick or have had a fever for a couple of days and are not getting better.  SEEK IMMEDIATE MEDICAL CARE IF:   · You have difficulty breathing.  · You no longer feel the baby moving.  · You are bleeding or have discharge from your vagina.  · You start having premature contractions or labor.  MAKE SURE YOU:  · Understand these instructions.  · Will watch your condition.  · Will get help right away if you are not doing well or get worse.     This information is not intended to replace advice given to you by your health care provider. Make sure you discuss any questions you have with your health care provider.     Document Released: 09/18/2000 Document Revised: 07/03/2014 Document Reviewed: 01/09/2012  Elsevier Interactive Patient Education ©2016 [employer]

## 2015-10-25 ENCOUNTER — Ambulatory Visit (INDEPENDENT_AMBULATORY_CARE_PROVIDER_SITE_OTHER): Payer: Medicaid Other | Admitting: Obstetrics & Gynecology

## 2015-10-25 VITALS — BP 104/67 | HR 77 | Temp 97.5°F | Wt 152.2 lb

## 2015-10-25 DIAGNOSIS — O24410 Gestational diabetes mellitus in pregnancy, diet controlled: Secondary | ICD-10-CM

## 2015-10-25 LAB — POCT URINALYSIS DIP (DEVICE)
Bilirubin Urine: NEGATIVE
GLUCOSE, UA: NEGATIVE mg/dL
Hgb urine dipstick: NEGATIVE
Ketones, ur: NEGATIVE mg/dL
Nitrite: NEGATIVE
PROTEIN: 30 mg/dL — AB
SPECIFIC GRAVITY, URINE: 1.02 (ref 1.005–1.030)
UROBILINOGEN UA: 1 mg/dL (ref 0.0–1.0)
pH: 7 (ref 5.0–8.0)

## 2015-10-25 NOTE — Progress Notes (Signed)
IOL scheduled for 05/14 @ 1145pm.

## 2015-10-25 NOTE — Progress Notes (Signed)
Subjective:  Kristi [name] is a 25 y.o. G1P0000 at 5585w0d being seen today for ongoing prenatal care.  She is currently monitored for the following issues for this high-risk pregnancy and has Kidney stones; Supervision of high risk pregnancy, antepartum; and Gestational diabetes on her problem list.  Patient reports rash on abdominal wall (PUPPS).  Contractions: Irregular.  .  Movement: Present. Denies leaking of fluid.   The following portions of the patient's history were reviewed and updated as appropriate: allergies, current medications, past family history, past medical history, past social history, past surgical history and problem list. Problem list updated.  Objective:   Filed Vitals:   10/25/15 1018  BP: 104/67  Pulse: 77  Temp: 97.5 F (36.4 C)  Weight: 152 lb 3.2 oz (69.037 kg)    Fetal Status: Fetal Heart Rate (bpm): 142   Movement: Present     General:  Alert, oriented and cooperative. Patient is in no acute distress.  Skin: Skin is warm and dry. No rash noted.   Cardiovascular: Normal heart rate noted  Respiratory: Normal respiratory effort, no problems with respiration noted  Abdomen: Soft, gravid, appropriate for gestational age. Pain/Pressure: Present     Pelvic:       Cervical exam deferred        Extremities: Normal range of motion.  Edema: None  Mental Status: Normal mood and affect. Normal behavior. Normal judgment and thought content.   Urinalysis:   see flow sheet  Assessment and Plan:  Pregnancy: G1P0000 at 7485w0d  1.  Gestational diabetes--diet controlled.  -CBG nml with diet -US for growth and confirm presentation tomorrow -Induce at 40 weeks  Term labor symptoms and general obstetric precautions including but not limited to vaginal bleeding, contractions, leaking of fluid and fetal movement were reviewed in detail with the patient. Please refer to After Visit Summary for other counseling recommendations.  Return in about 1 week (around  11/01/2015).   Lesly DukesKelly H Leggett, MD

## 2015-10-26 ENCOUNTER — Ambulatory Visit (HOSPITAL_COMMUNITY)
Admission: RE | Admit: 2015-10-26 | Discharge: 2015-10-26 | Disposition: A | Discharge status: Home / Self Care | Payer: Medicaid Other | Source: Ambulatory Visit | Attending: Obstetrics & Gynecology | Admitting: Obstetrics & Gynecology

## 2015-10-26 ENCOUNTER — Other Ambulatory Visit: Payer: Self-pay | Admitting: Obstetrics & Gynecology

## 2015-10-26 ENCOUNTER — Encounter (HOSPITAL_COMMUNITY): Payer: Self-pay

## 2015-10-26 VITALS — BP 115/63 | HR 72 | Wt 153.6 lb

## 2015-10-26 DIAGNOSIS — Z3A38 38 weeks gestation of pregnancy: Secondary | ICD-10-CM | POA: Insufficient documentation

## 2015-10-26 DIAGNOSIS — O358XX1 Maternal care for other (suspected) fetal abnormality and damage, fetus 1: Secondary | ICD-10-CM

## 2015-10-26 DIAGNOSIS — O35EXX1 Maternal care for other (suspected) fetal abnormality and damage, fetal genitourinary anomalies, fetus 1: Secondary | ICD-10-CM

## 2015-10-26 DIAGNOSIS — O24410 Gestational diabetes mellitus in pregnancy, diet controlled: Secondary | ICD-10-CM | POA: Insufficient documentation

## 2015-10-27 ENCOUNTER — Encounter: Payer: Self-pay | Admitting: *Deleted

## 2015-10-27 DIAGNOSIS — O35EXX0 Maternal care for other (suspected) fetal abnormality and damage, fetal genitourinary anomalies, not applicable or unspecified: Secondary | ICD-10-CM | POA: Insufficient documentation

## 2015-10-27 DIAGNOSIS — O358XX0 Maternal care for other (suspected) fetal abnormality and damage, not applicable or unspecified: Secondary | ICD-10-CM | POA: Insufficient documentation

## 2015-11-01 ENCOUNTER — Encounter: Payer: Self-pay | Admitting: Obstetrics and Gynecology

## 2015-11-01 ENCOUNTER — Ambulatory Visit (INDEPENDENT_AMBULATORY_CARE_PROVIDER_SITE_OTHER): Payer: Medicaid Other | Admitting: Obstetrics and Gynecology

## 2015-11-01 VITALS — BP 105/73 | HR 80 | Wt 152.0 lb

## 2015-11-01 DIAGNOSIS — O358XX0 Maternal care for other (suspected) fetal abnormality and damage, not applicable or unspecified: Secondary | ICD-10-CM

## 2015-11-01 DIAGNOSIS — O24410 Gestational diabetes mellitus in pregnancy, diet controlled: Secondary | ICD-10-CM | POA: Diagnosis present

## 2015-11-01 DIAGNOSIS — O35EXX0 Maternal care for other (suspected) fetal abnormality and damage, fetal genitourinary anomalies, not applicable or unspecified: Secondary | ICD-10-CM

## 2015-11-01 DIAGNOSIS — O0993 Supervision of high risk pregnancy, unspecified, third trimester: Secondary | ICD-10-CM

## 2015-11-01 LAB — POCT URINALYSIS DIP (DEVICE)
Bilirubin Urine: NEGATIVE
GLUCOSE, UA: NEGATIVE mg/dL
HGB URINE DIPSTICK: NEGATIVE
Ketones, ur: NEGATIVE mg/dL
Nitrite: NEGATIVE
PH: 7 (ref 5.0–8.0)
PROTEIN: NEGATIVE mg/dL
SPECIFIC GRAVITY, URINE: 1.015 (ref 1.005–1.030)
UROBILINOGEN UA: 1 mg/dL (ref 0.0–1.0)

## 2015-11-01 NOTE — Progress Notes (Signed)
Subjective:  Sherrol L Daivd CouncilBui is a 25 y.o. G1P0000 at 6761w0d being seen today for ongoing prenatal care.  She is currently monitored for the following issues for this high-risk pregnancy and has Kidney stones; Supervision of high risk pregnancy, antepartum; Gestational diabetes; and Fetal UTD A2 during pregnancy, antepartum on her problem list.  Patient reports no complaints.  Contractions: Irregular. Vag. Bleeding: None.  Movement: Present. Denies leaking of fluid.   The following portions of the patient's history were reviewed and updated as appropriate: allergies, current medications, past family history, past medical history, past social history, past surgical history and problem list. Problem list updated.  Objective:   Filed Vitals:   11/01/15 1152  BP: 105/73  Pulse: 80  Weight: 152 lb (68.947 kg)    Fetal Status: Fetal Heart Rate (bpm): 147   Movement: Present     General:  Alert, oriented and cooperative. Patient is in no acute distress.  Skin: Skin is warm and dry. No rash noted.   Cardiovascular: Normal heart rate noted  Respiratory: Normal respiratory effort, no problems with respiration noted  Abdomen: Soft, gravid, appropriate for gestational age. Pain/Pressure: Absent     Pelvic: Vag. Bleeding: None Vag D/C Character: White   Cervical exam deferred        Extremities: Normal range of motion.  Edema: None  Mental Status: Normal mood and affect. Normal behavior. Normal judgment and thought content.   Urinalysis: Urine Protein: Negative Urine Glucose: Negative  Assessment and Plan:  Pregnancy: G1P0000 at 8261w0d  1. Diet controlled gestational diabetes mellitus in third trimester CBGs reviewed and great majority within range: 1 abnormal fasting 94, 1 abnormal pL 126 and 1 abnormal p D 125 Patient scheduled for IOL at 40 weeks on 5/15   2. Supervision of high risk pregnancy, antepartum, third trimester   3. Fetal hydronephrosis during pregnancy, antepartum, not applicable  or unspecified fetus Postnatal follow up required  Term labor symptoms and general obstetric precautions including but not limited to vaginal bleeding, contractions, leaking of fluid and fetal movement were reviewed in detail with the patient. Please refer to After Visit Summary for other counseling recommendations.  Return in about 6 weeks (around 12/13/2015) for pp visit.   Catalina AntiguaPeggy Francoise Chojnowski, MD

## 2015-11-03 ENCOUNTER — Telehealth (HOSPITAL_COMMUNITY): Payer: Self-pay | Admitting: *Deleted

## 2015-11-03 NOTE — Telephone Encounter (Signed)
Preadmission screen Interpreter number [phone]

## 2015-11-04 ENCOUNTER — Telehealth: Payer: Self-pay | Admitting: *Deleted

## 2015-11-04 ENCOUNTER — Inpatient Hospital Stay (HOSPITAL_COMMUNITY)
Admission: AD | Admit: 2015-11-04 | Discharge: 2015-11-04 | Disposition: A | Discharge status: Home / Self Care | Payer: Medicaid Other | Source: Ambulatory Visit | Attending: Obstetrics and Gynecology | Admitting: Obstetrics and Gynecology

## 2015-11-04 ENCOUNTER — Encounter (HOSPITAL_COMMUNITY): Payer: Self-pay | Admitting: *Deleted

## 2015-11-04 DIAGNOSIS — O26893 Other specified pregnancy related conditions, third trimester: Secondary | ICD-10-CM | POA: Diagnosis not present

## 2015-11-04 DIAGNOSIS — N898 Other specified noninflammatory disorders of vagina: Secondary | ICD-10-CM | POA: Insufficient documentation

## 2015-11-04 DIAGNOSIS — Z3A39 39 weeks gestation of pregnancy: Secondary | ICD-10-CM | POA: Insufficient documentation

## 2015-11-04 NOTE — Telephone Encounter (Signed)
Pt. Left a message she has a question- states has appointment for Induction in 3 days. States her stomach is hurting like need to go to restroom. Wants to know if should go to hospital or wait for induction.   Called Modene with [employer] [phone] and she states she was feeling like need to have bm, but couldn't. States also thinks having contractions  Every 5 minutes last night, not as much today, but c/o a water like clear  substance came out last night. After much discussion to explain what contractions are,  About if water breaks needs to come to hospital and may not be gush or continuous leak, etc. Advised her to come to [facility] for evaluation today asap For evaluation of possible rom/ labor . If not in labor or ROM can discuss further issues with provider. She voices understanding.

## 2015-11-04 NOTE — Discharge Instructions (Signed)
Vaginal Delivery °During delivery, your health care provider will help you give birth to your baby. During a vaginal delivery, you will work to push the baby out of your vagina. However, before you can push your baby out, a few things need to happen. The opening of your uterus (cervix) has to soften, thin out, and open up (dilate) all the way to 10 cm. Also, your baby has to move down from the uterus into your vagina.  °SIGNS OF LABOR  °Your health care provider will first need to make sure you are in labor. Signs of labor include:  °· Passing what is called the mucous plug before labor begins. This is a small amount of blood-stained mucus. °· Having regular, painful uterine contractions.   °· The time between contractions gets shorter.   °· The discomfort and pain gradually get more intense. °· Contraction pains get worse when walking and do not go away when resting.   °· Your cervix becomes thinner (effacement) and dilates. °BEFORE THE DELIVERY °Once you are in labor and admitted into the hospital or care center, your health care provider may do the following:  °· Perform a complete physical exam. °· Review any complications related to pregnancy or labor.  °· Check your blood pressure, pulse, temperature, and heart rate (vital signs).   °· Determine if, and when, the rupture of amniotic membranes occurred. °· Do a vaginal exam (using a sterile glove and lubricant) to determine:   °¨ The position (presentation) of the baby. Is the baby's head presenting first (vertex) in the birth canal (vagina), or are the feet or buttocks first (breech)?   °¨ The level (station) of the baby's head within the birth canal.   °¨ The effacement and dilatation of the cervix.   °· An electronic fetal monitor is usually placed on your abdomen when you first arrive. This is used to monitor your contractions and the baby's heart rate. °¨ When the monitor is on your abdomen (external fetal monitor), it can only pick up the frequency and  length of your contractions. It cannot tell the strength of your contractions. °¨ If it becomes necessary for your health care provider to know exactly how strong your contractions are or to see exactly what the baby's heart rate is doing, an internal monitor may be inserted into your vagina and uterus. Your health care provider will discuss the benefits and risks of using an internal monitor and obtain your permission before inserting the device. °¨ Continuous fetal monitoring may be needed if you have an epidural, are receiving certain medicines (such as oxytocin), or have pregnancy or labor complications. °· An IV access tube may be placed into a vein in your arm to deliver fluids and medicines if necessary. °THREE STAGES OF LABOR AND DELIVERY °Normal labor and delivery is divided into three stages. °First Stage °This stage starts when you begin to contract regularly and your cervix begins to efface and dilate. It ends when your cervix is completely open (fully dilated). The first stage is the longest stage of labor and can last from 3 hours to 15 hours.  °Several methods are available to help with labor pain. You and your health care provider will decide which option is best for you. Options include:  °· Opioid medicines. These are strong pain medicines that you can get through your IV tube or as a shot into your muscle. These medicines lessen pain but do not make it go away completely.  °· Epidural. A medicine is given through a thin tube that   is inserted in your back. The medicine numbs the lower part of your body and prevents any pain in that area. °· Paracervical pain medicine. This is an injection of an anesthetic on each side of your cervix.   °· You may request natural childbirth, which does not involve the use of pain medicines or an epidural during labor and delivery. Instead, you will use other things, such as breathing exercises, to help cope with the pain. °Second Stage °The second stage of labor  begins when your cervix is fully dilated at 10 cm. It continues until you push your baby down through the birth canal and the baby is born. This stage can take only minutes or several hours. °· The location of your baby's head as it moves through the birth canal is reported as a number called a station. If the baby's head has not started its descent, the station is described as being at minus 3 (-3). When your baby's head is at the zero station, it is at the middle of the birth canal and is engaged in the pelvis. The station of your baby helps indicate the progress of the second stage of labor. °· When your baby is born, your health care provider may hold the baby with his or her head lowered to prevent amniotic fluid, mucus, and blood from getting into the baby's lungs. The baby's mouth and nose may be suctioned with a small bulb syringe to remove any additional fluid. °· Your health care provider may then place the baby on your stomach. It is important to keep the baby from getting cold. To do this, the health care provider will dry the baby off, place the baby directly on your skin (with no blankets between you and the baby), and cover the baby with warm, dry blankets.   °· The umbilical cord is cut. °Third Stage °During the third stage of labor, your health care provider will deliver the placenta (afterbirth) and make sure your bleeding is under control. The delivery of the placenta usually takes about 5 minutes but can take up to 30 minutes. After the placenta is delivered, a medicine may be given either by IV or injection to help contract the uterus and control bleeding. If you are planning to breastfeed, you can try to do so now. °After you deliver the placenta, your uterus should contract and get very firm. If your uterus does not remain firm, your health care provider will massage it. This is important because the contraction of the uterus helps cut off bleeding at the site where the placenta was attached  to your uterus. If your uterus does not contract properly and stay firm, you may continue to bleed heavily. If there is a lot of bleeding, medicines may be given to contract the uterus and stop the bleeding.  °  °This information is not intended to replace advice given to you by your health care provider. Make sure you discuss any questions you have with your health care provider. °  °Document Released: 03/21/2008 Document Revised: 07/03/2014 Document Reviewed: 02/07/2012 °Elsevier Interactive Patient Education ©2016 [employer] ° °

## 2015-11-04 NOTE — [facility] Note (Signed)
Pt reprots she is having abd pain since last night. Thinks she had some leaking last night and some today.

## 2015-11-04 NOTE — MAU Provider Note (Signed)
S:  Ms. Kristi [name] is a 25 y.o. female G1P0000 at 459w3d presenting to MAU with concerns about ? ROM/ vaginal discharge. She feels increased wetness, and white vaginal bleeding. No gushes of water.    RN note: Pt reprots she is having abd pain since last night. Thinks she had some leaking last night and some today.   O:  GENERAL: Well-developed, well-nourished female in no acute distress.  LUNGS: Effort normal SKIN: Warm, dry and without erythema ABDOMEN: soft, non tender.  PSYCH: Normal mood and affect  Filed Vitals:   11/04/15 1358 11/04/15 1527  BP: 127/86 116/77  Pulse: 101 87  Temp: 98 F (36.7 C) 98.2 F (36.8 C)  TempSrc: Oral Oral  Resp: 18 18  Weight: 153 lb 6.4 oz (69.582 kg)    Speculum exam: Vagina - Small amount of creamy, pink discharge, no odor. No pooling of fluid in the vagina.  Cervix - No contact bleeding, no active bleeding  Bimanual exam: Dilation: Closed Effacement (%): Thick Cervical Position: Posterior Station: -3 Exam by:: L. Paschal, RN  Chaperone present for exam.  Fern slide collected: negative    A:  1. Vaginal discharge in third trimester, antepartum    P:  Discharge home in stable condition Labor precautions Return to MAU if symptoms worsen Fetal kick counts.   Duane LopeJennifer I Rasch, NP 11/04/2015 2:47 PM

## 2015-11-07 ENCOUNTER — Inpatient Hospital Stay (HOSPITAL_COMMUNITY)
Admission: AD | Admit: 2015-11-07 | Discharge: 2015-11-07 | Disposition: A | Discharge status: Home / Self Care | Payer: Medicaid Other | Source: Ambulatory Visit | Attending: Obstetrics & Gynecology | Admitting: Obstetrics & Gynecology

## 2015-11-07 ENCOUNTER — Encounter (HOSPITAL_COMMUNITY): Payer: Self-pay | Admitting: Certified Nurse Midwife

## 2015-11-07 NOTE — MAU Note (Signed)
Pt states she had some brown and pink specks when she wipes. Pt denies ctxs or LOF. Pt states +FM.

## 2015-11-07 NOTE — Discharge Instructions (Signed)
Fetal Movement Counts °Patient Name: __________________________________________________ Patient Due Date: ____________________ °Performing a fetal movement count is highly recommended in high-risk pregnancies, but it is good for every pregnant woman to do. Your health care provider may ask you to start counting fetal movements at 28 weeks of the pregnancy. Fetal movements often increase: °· After eating a full meal. °· After physical activity. °· After eating or drinking something sweet or cold. °· At rest. °Pay attention to when you feel the baby is most active. This will help you notice a pattern of your baby's sleep and wake cycles and what factors contribute to an increase in fetal movement. It is important to perform a fetal movement count at the same time each day when your baby is normally most active.  °HOW TO COUNT FETAL MOVEMENTS °1. Find a quiet and comfortable area to sit or lie down on your left side. Lying on your left side provides the best blood and oxygen circulation to your baby. °2. Write down the day and time on a sheet of paper or in a journal. °3. Start counting kicks, flutters, swishes, rolls, or jabs in a 2-hour period. You should feel at least 10 movements within 2 hours. °4. If you do not feel 10 movements in 2 hours, wait 2-3 hours and count again. Look for a change in the pattern or not enough counts in 2 hours. °SEEK MEDICAL CARE IF: °· You feel less than 10 counts in 2 hours, tried twice. °· There is no movement in over an hour. °· The pattern is changing or taking longer each day to reach 10 counts in 2 hours. °· You feel the baby is not moving as he or she usually does. °Date: ____________ Movements: ____________ Start time: ____________ Finish time: ____________  °Date: ____________ Movements: ____________ Start time: ____________ Finish time: ____________ °Date: ____________ Movements: ____________ Start time: ____________ Finish time: ____________ °Date: ____________ Movements:  ____________ Start time: ____________ Finish time: ____________ °Date: ____________ Movements: ____________ Start time: ____________ Finish time: ____________ °Date: ____________ Movements: ____________ Start time: ____________ Finish time: ____________ °Date: ____________ Movements: ____________ Start time: ____________ Finish time: ____________ °Date: ____________ Movements: ____________ Start time: ____________ Finish time: ____________  °Date: ____________ Movements: ____________ Start time: ____________ Finish time: ____________ °Date: ____________ Movements: ____________ Start time: ____________ Finish time: ____________ °Date: ____________ Movements: ____________ Start time: ____________ Finish time: ____________ °Date: ____________ Movements: ____________ Start time: ____________ Finish time: ____________ °Date: ____________ Movements: ____________ Start time: ____________ Finish time: ____________ °Date: ____________ Movements: ____________ Start time: ____________ Finish time: ____________ °Date: ____________ Movements: ____________ Start time: ____________ Finish time: ____________  °Date: ____________ Movements: ____________ Start time: ____________ Finish time: ____________ °Date: ____________ Movements: ____________ Start time: ____________ Finish time: ____________ °Date: ____________ Movements: ____________ Start time: ____________ Finish time: ____________ °Date: ____________ Movements: ____________ Start time: ____________ Finish time: ____________ °Date: ____________ Movements: ____________ Start time: ____________ Finish time: ____________ °Date: ____________ Movements: ____________ Start time: ____________ Finish time: ____________ °Date: ____________ Movements: ____________ Start time: ____________ Finish time: ____________  °Date: ____________ Movements: ____________ Start time: ____________ Finish time: ____________ °Date: ____________ Movements: ____________ Start time: ____________ Finish  time: ____________ °Date: ____________ Movements: ____________ Start time: ____________ Finish time: ____________ °Date: ____________ Movements: ____________ Start time: ____________ Finish time: ____________ °Date: ____________ Movements: ____________ Start time: ____________ Finish time: ____________ °Date: ____________ Movements: ____________ Start time: ____________ Finish time: ____________ °Date: ____________ Movements: ____________ Start time: ____________ Finish time: ____________  °Date: ____________ Movements: ____________ Start time: ____________ Finish   time: ____________ °Date: ____________ Movements: ____________ Start time: ____________ Finish time: ____________ °Date: ____________ Movements: ____________ Start time: ____________ Finish time: ____________ °Date: ____________ Movements: ____________ Start time: ____________ Finish time: ____________ °Date: ____________ Movements: ____________ Start time: ____________ Finish time: ____________ °Date: ____________ Movements: ____________ Start time: ____________ Finish time: ____________ °Date: ____________ Movements: ____________ Start time: ____________ Finish time: ____________  °Date: ____________ Movements: ____________ Start time: ____________ Finish time: ____________ °Date: ____________ Movements: ____________ Start time: ____________ Finish time: ____________ °Date: ____________ Movements: ____________ Start time: ____________ Finish time: ____________ °Date: ____________ Movements: ____________ Start time: ____________ Finish time: ____________ °Date: ____________ Movements: ____________ Start time: ____________ Finish time: ____________ °Date: ____________ Movements: ____________ Start time: ____________ Finish time: ____________ °Date: ____________ Movements: ____________ Start time: ____________ Finish time: ____________  °Date: ____________ Movements: ____________ Start time: ____________ Finish time: ____________ °Date: ____________  Movements: ____________ Start time: ____________ Finish time: ____________ °Date: ____________ Movements: ____________ Start time: ____________ Finish time: ____________ °Date: ____________ Movements: ____________ Start time: ____________ Finish time: ____________ °Date: ____________ Movements: ____________ Start time: ____________ Finish time: ____________ °Date: ____________ Movements: ____________ Start time: ____________ Finish time: ____________ °Date: ____________ Movements: ____________ Start time: ____________ Finish time: ____________  °Date: ____________ Movements: ____________ Start time: ____________ Finish time: ____________ °Date: ____________ Movements: ____________ Start time: ____________ Finish time: ____________ °Date: ____________ Movements: ____________ Start time: ____________ Finish time: ____________ °Date: ____________ Movements: ____________ Start time: ____________ Finish time: ____________ °Date: ____________ Movements: ____________ Start time: ____________ Finish time: ____________ °Date: ____________ Movements: ____________ Start time: ____________ Finish time: ____________ °  °This information is not intended to replace advice given to you by your health care provider. Make sure you discuss any questions you have with your health care provider. °  °Document Released: 07/12/2006 Document Revised: 07/03/2014 Document Reviewed: 04/08/2012 °Elsevier Interactive Patient Education ©2016 [employer] °Vaginal Delivery °During delivery, your health care provider will help you give birth to your baby. During a vaginal delivery, you will work to push the baby out of your vagina. However, before you can push your baby out, a few things need to happen. The opening of your uterus (cervix) has to soften, thin out, and open up (dilate) all the way to 10 cm. Also, your baby has to move down from the uterus into your vagina.  °SIGNS OF LABOR  °Your health care provider will first need to make sure you  are in labor. Signs of labor include:  °· Passing what is called the mucous plug before labor begins. This is a small amount of blood-stained mucus. °· Having regular, painful uterine contractions.   °· The time between contractions gets shorter.   °· The discomfort and pain gradually get more intense. °· Contraction pains get worse when walking and do not go away when resting.   °· Your cervix becomes thinner (effacement) and dilates. °BEFORE THE DELIVERY °Once you are in labor and admitted into the hospital or care center, your health care provider may do the following:  °5. Perform a complete physical exam. °6. Review any complications related to pregnancy or labor.  °7. Check your blood pressure, pulse, temperature, and heart rate (vital signs).   °8. Determine if, and when, the rupture of amniotic membranes occurred. °9. Do a vaginal exam (using a sterile glove and lubricant) to determine:   °1. The position (presentation) of the baby. Is the baby's head presenting first (vertex) in the birth canal (vagina), or are the feet or buttocks first (breech)?   °2. The level (station) of the baby's head within   the birth canal.   °3. The effacement and dilatation of the cervix.   °10. An electronic fetal monitor is usually placed on your abdomen when you first arrive. This is used to monitor your contractions and the baby's heart rate. °1. When the monitor is on your abdomen (external fetal monitor), it can only pick up the frequency and length of your contractions. It cannot tell the strength of your contractions. °2. If it becomes necessary for your health care provider to know exactly how strong your contractions are or to see exactly what the baby's heart rate is doing, an internal monitor may be inserted into your vagina and uterus. Your health care provider will discuss the benefits and risks of using an internal monitor and obtain your permission before inserting the device. °3. Continuous fetal monitoring may be  needed if you have an epidural, are receiving certain medicines (such as oxytocin), or have pregnancy or labor complications. °11. An IV access tube may be placed into a vein in your arm to deliver fluids and medicines if necessary. °THREE STAGES OF LABOR AND DELIVERY °Normal labor and delivery is divided into three stages. °First Stage °This stage starts when you begin to contract regularly and your cervix begins to efface and dilate. It ends when your cervix is completely open (fully dilated). The first stage is the longest stage of labor and can last from 3 hours to 15 hours.  °Several methods are available to help with labor pain. You and your health care provider will decide which option is best for you. Options include:  °· Opioid medicines. These are strong pain medicines that you can get through your IV tube or as a shot into your muscle. These medicines lessen pain but do not make it go away completely.  °· Epidural. A medicine is given through a thin tube that is inserted in your back. The medicine numbs the lower part of your body and prevents any pain in that area. °· Paracervical pain medicine. This is an injection of an anesthetic on each side of your cervix.   °· You may request natural childbirth, which does not involve the use of pain medicines or an epidural during labor and delivery. Instead, you will use other things, such as breathing exercises, to help cope with the pain. °Second Stage °The second stage of labor begins when your cervix is fully dilated at 10 cm. It continues until you push your baby down through the birth canal and the baby is born. This stage can take only minutes or several hours. °· The location of your baby's head as it moves through the birth canal is reported as a number called a station. If the baby's head has not started its descent, the station is described as being at minus 3 (-3). When your baby's head is at the zero station, it is at the middle of the birth canal  and is engaged in the pelvis. The station of your baby helps indicate the progress of the second stage of labor. °· When your baby is born, your health care provider may hold the baby with his or her head lowered to prevent amniotic fluid, mucus, and blood from getting into the baby's lungs. The baby's mouth and nose may be suctioned with a small bulb syringe to remove any additional fluid. °· Your health care provider may then place the baby on your stomach. It is important to keep the baby from getting cold. To do this, the health care provider will dry   the baby off, place the baby directly on your skin (with no blankets between you and the baby), and cover the baby with warm, dry blankets.   °· The umbilical cord is cut. °Third Stage °During the third stage of labor, your health care provider will deliver the placenta (afterbirth) and make sure your bleeding is under control. The delivery of the placenta usually takes about 5 minutes but can take up to 30 minutes. After the placenta is delivered, a medicine may be given either by IV or injection to help contract the uterus and control bleeding. If you are planning to breastfeed, you can try to do so now. °After you deliver the placenta, your uterus should contract and get very firm. If your uterus does not remain firm, your health care provider will massage it. This is important because the contraction of the uterus helps cut off bleeding at the site where the placenta was attached to your uterus. If your uterus does not contract properly and stay firm, you may continue to bleed heavily. If there is a lot of bleeding, medicines may be given to contract the uterus and stop the bleeding.  °  °This information is not intended to replace advice given to you by your health care provider. Make sure you discuss any questions you have with your health care provider. °  °Document Released: 03/21/2008 Document Revised: 07/03/2014 Document Reviewed: 02/07/2012 °Elsevier  Interactive Patient Education ©2016 Else[employer] Inc. ° °

## 2015-11-08 ENCOUNTER — Inpatient Hospital Stay (HOSPITAL_COMMUNITY): Payer: Medicaid Other | Admitting: Anesthesiology

## 2015-11-08 ENCOUNTER — Inpatient Hospital Stay (HOSPITAL_COMMUNITY)
Admission: RE | Admit: 2015-11-08 | Discharge: 2015-11-11 | DRG: 766 | Disposition: A | Discharge status: Home / Self Care | Payer: Medicaid Other | Source: Ambulatory Visit | Attending: Obstetrics & Gynecology | Admitting: Obstetrics & Gynecology

## 2015-11-08 ENCOUNTER — Encounter (HOSPITAL_COMMUNITY): Payer: Self-pay

## 2015-11-08 ENCOUNTER — Encounter (HOSPITAL_COMMUNITY)
Admission: RE | Disposition: A | Discharge status: Home / Self Care | Payer: Self-pay | Source: Ambulatory Visit | Attending: Obstetrics & Gynecology

## 2015-11-08 VITALS — BP 111/68 | HR 80 | Temp 98.2°F | Resp 18 | Ht 62.0 in | Wt 155.0 lb

## 2015-11-08 DIAGNOSIS — O24429 Gestational diabetes mellitus in childbirth, unspecified control: Secondary | ICD-10-CM | POA: Diagnosis not present

## 2015-11-08 DIAGNOSIS — Z8249 Family history of ischemic heart disease and other diseases of the circulatory system: Secondary | ICD-10-CM | POA: Diagnosis not present

## 2015-11-08 DIAGNOSIS — Z833 Family history of diabetes mellitus: Secondary | ICD-10-CM

## 2015-11-08 DIAGNOSIS — O24420 Gestational diabetes mellitus in childbirth, diet controlled: Principal | ICD-10-CM | POA: Diagnosis present

## 2015-11-08 DIAGNOSIS — O24419 Gestational diabetes mellitus in pregnancy, unspecified control: Secondary | ICD-10-CM | POA: Diagnosis present

## 2015-11-08 DIAGNOSIS — Z3490 Encounter for supervision of normal pregnancy, unspecified, unspecified trimester: Secondary | ICD-10-CM

## 2015-11-08 DIAGNOSIS — Z87442 Personal history of urinary calculi: Secondary | ICD-10-CM

## 2015-11-08 DIAGNOSIS — Z98891 History of uterine scar from previous surgery: Secondary | ICD-10-CM

## 2015-11-08 DIAGNOSIS — Z3A40 40 weeks gestation of pregnancy: Secondary | ICD-10-CM

## 2015-11-08 LAB — CBC
HEMATOCRIT: 35.5 % — AB (ref 36.0–46.0)
HEMOGLOBIN: 11.7 g/dL — AB (ref 12.0–15.0)
MCH: 21.3 pg — ABNORMAL LOW (ref 26.0–34.0)
MCHC: 33 g/dL (ref 30.0–36.0)
MCV: 64.5 fL — ABNORMAL LOW (ref 78.0–100.0)
Platelets: 155 10*3/uL (ref 150–400)
RBC: 5.5 MIL/uL — ABNORMAL HIGH (ref 3.87–5.11)
RDW: 15.1 % (ref 11.5–15.5)
WBC: 10.4 10*3/uL (ref 4.0–10.5)

## 2015-11-08 LAB — ABO/RH: ABO/RH(D): O POS

## 2015-11-08 LAB — TYPE AND SCREEN
ABO/RH(D): O POS
ANTIBODY SCREEN: NEGATIVE

## 2015-11-08 LAB — GLUCOSE, CAPILLARY: GLUCOSE-CAPILLARY: 99 mg/dL (ref 65–99)

## 2015-11-08 SURGERY — Surgical Case
Anesthesia: Epidural

## 2015-11-08 MED ORDER — OXYCODONE-ACETAMINOPHEN 5-325 MG PO TABS: 2.0000 | ORAL_TABLET | ORAL | Status: DC | PRN

## 2015-11-08 MED ORDER — NALOXONE HCL 2 MG/2ML IJ SOSY
1.0000 ug/kg/h | PREFILLED_SYRINGE | INTRAVENOUS | Status: DC | PRN
  Filled 2015-11-08: qty 2

## 2015-11-08 MED ORDER — PHENYLEPHRINE 40 MCG/ML (10ML) SYRINGE FOR IV PUSH (FOR BLOOD PRESSURE SUPPORT)
80.0000 ug | PREFILLED_SYRINGE | INTRAVENOUS | Status: DC | PRN
  Filled 2015-11-08: qty 10

## 2015-11-08 MED ORDER — LIDOCAINE HCL (PF) 1 % IJ SOLN: 30.0000 mL | INTRAMUSCULAR | Status: DC | PRN

## 2015-11-08 MED ORDER — SCOPOLAMINE 1 MG/3DAYS TD PT72: 1.0000 | MEDICATED_PATCH | Freq: Once | TRANSDERMAL | Status: DC

## 2015-11-08 MED ORDER — KETOROLAC TROMETHAMINE 30 MG/ML IJ SOLN
30.0000 mg | Freq: Four times a day (QID) | INTRAMUSCULAR | Status: DC | PRN
  Administered 2015-11-08: 30 mg via INTRAMUSCULAR

## 2015-11-08 MED ORDER — LACTATED RINGERS IV SOLN
INTRAVENOUS | Status: DC | PRN
  Administered 2015-11-08: 07:00:00 via INTRAVENOUS

## 2015-11-08 MED ORDER — FLEET ENEMA 7-19 GM/118ML RE ENEM: 1.0000 | ENEMA | RECTAL | Status: DC | PRN

## 2015-11-08 MED ORDER — NALBUPHINE HCL 10 MG/ML IJ SOLN: 5.0000 mg | INTRAMUSCULAR | Status: DC | PRN

## 2015-11-08 MED ORDER — OXYTOCIN BOLUS FROM INFUSION: 500.0000 mL | INTRAVENOUS | Status: DC

## 2015-11-08 MED ORDER — OXYTOCIN 40 UNITS IN LACTATED RINGERS INFUSION - SIMPLE MED: 2.5000 [IU]/h | INTRAVENOUS | Status: AC

## 2015-11-08 MED ORDER — NALBUPHINE HCL 10 MG/ML IJ SOLN: 5.0000 mg | Freq: Once | INTRAMUSCULAR | Status: DC | PRN

## 2015-11-08 MED ORDER — OXYTOCIN 10 UNIT/ML IJ SOLN
40.0000 [IU] | INTRAVENOUS | Status: DC | PRN
  Administered 2015-11-08: 40 [IU] via INTRAVENOUS

## 2015-11-08 MED ORDER — MORPHINE SULFATE (PF) 0.5 MG/ML IJ SOLN
INTRAMUSCULAR | Status: AC
  Filled 2015-11-08: qty 10

## 2015-11-08 MED ORDER — OXYCODONE-ACETAMINOPHEN 5-325 MG PO TABS: 1.0000 | ORAL_TABLET | ORAL | Status: DC | PRN

## 2015-11-08 MED ORDER — PHENYLEPHRINE HCL 10 MG/ML IJ SOLN
INTRAMUSCULAR | Status: DC | PRN
  Administered 2015-11-08: 80 ug via INTRAVENOUS
  Administered 2015-11-08: 40 ug via INTRAVENOUS
  Administered 2015-11-08: 80 ug via INTRAVENOUS

## 2015-11-08 MED ORDER — PRENATAL MULTIVITAMIN CH
1.0000 | ORAL_TABLET | Freq: Every day | ORAL | Status: DC
  Administered 2015-11-08 – 2015-11-10 (×3): 1 via ORAL
  Filled 2015-11-08 (×3): qty 1

## 2015-11-08 MED ORDER — LACTATED RINGERS IV SOLN: 500.0000 mL | Freq: Once | INTRAVENOUS | Status: DC

## 2015-11-08 MED ORDER — DIPHENHYDRAMINE HCL 50 MG/ML IJ SOLN
12.5000 mg | INTRAMUSCULAR | Status: DC | PRN
  Administered 2015-11-08: 12.5 mg via INTRAVENOUS
  Filled 2015-11-08: qty 1

## 2015-11-08 MED ORDER — LIDOCAINE-EPINEPHRINE (PF) 2 %-1:200000 IJ SOLN
INTRAMUSCULAR | Status: AC
  Filled 2015-11-08: qty 20

## 2015-11-08 MED ORDER — IBUPROFEN 600 MG PO TABS
600.0000 mg | ORAL_TABLET | Freq: Four times a day (QID) | ORAL | Status: DC
  Administered 2015-11-08 – 2015-11-11 (×11): 600 mg via ORAL
  Filled 2015-11-08 (×12): qty 1

## 2015-11-08 MED ORDER — CITRIC ACID-SODIUM CITRATE 334-500 MG/5ML PO SOLN
30.0000 mL | ORAL | Status: DC | PRN
  Administered 2015-11-08: 30 mL via ORAL
  Filled 2015-11-08: qty 15

## 2015-11-08 MED ORDER — MEPERIDINE HCL 25 MG/ML IJ SOLN
6.2500 mg | INTRAMUSCULAR | Status: DC | PRN
Start: 2015-11-08 — End: 2015-11-08

## 2015-11-08 MED ORDER — MORPHINE SULFATE (PF) 0.5 MG/ML IJ SOLN
INTRAMUSCULAR | Status: DC | PRN
  Administered 2015-11-08: 4 mg via EPIDURAL

## 2015-11-08 MED ORDER — DIBUCAINE 1 % RE OINT: 1.0000 "application " | TOPICAL_OINTMENT | RECTAL | Status: DC | PRN

## 2015-11-08 MED ORDER — DIPHENHYDRAMINE HCL 50 MG/ML IJ SOLN: 12.5000 mg | INTRAMUSCULAR | Status: DC | PRN

## 2015-11-08 MED ORDER — MISOPROSTOL 25 MCG QUARTER TABLET
25.0000 ug | ORAL_TABLET | ORAL | Status: DC | PRN
  Filled 2015-11-08: qty 0.25

## 2015-11-08 MED ORDER — ONDANSETRON HCL 4 MG/2ML IJ SOLN: 4.0000 mg | Freq: Four times a day (QID) | INTRAMUSCULAR | Status: DC | PRN

## 2015-11-08 MED ORDER — COCONUT OIL OIL
1.0000 "application " | TOPICAL_OIL | Status: DC | PRN
  Administered 2015-11-10: 1 via TOPICAL
  Filled 2015-11-08: qty 120

## 2015-11-08 MED ORDER — KETOROLAC TROMETHAMINE 30 MG/ML IJ SOLN
INTRAMUSCULAR | Status: AC
  Filled 2015-11-08: qty 1

## 2015-11-08 MED ORDER — EPHEDRINE 5 MG/ML INJ: 10.0000 mg | INTRAVENOUS | Status: DC | PRN

## 2015-11-08 MED ORDER — FENTANYL CITRATE (PF) 100 MCG/2ML IJ SOLN: 25.0000 ug | INTRAMUSCULAR | Status: DC | PRN

## 2015-11-08 MED ORDER — ACETAMINOPHEN 325 MG PO TABS: 650.0000 mg | ORAL_TABLET | ORAL | Status: DC | PRN

## 2015-11-08 MED ORDER — NALOXONE HCL 0.4 MG/ML IJ SOLN: 0.4000 mg | INTRAMUSCULAR | Status: DC | PRN

## 2015-11-08 MED ORDER — SIMETHICONE 80 MG PO CHEW
80.0000 mg | CHEWABLE_TABLET | ORAL | Status: DC
  Administered 2015-11-08 – 2015-11-11 (×3): 80 mg via ORAL
  Filled 2015-11-08 (×3): qty 1

## 2015-11-08 MED ORDER — SODIUM CHLORIDE 0.9% FLUSH: 3.0000 mL | INTRAVENOUS | Status: DC | PRN

## 2015-11-08 MED ORDER — LACTATED RINGERS IV SOLN
500.0000 mL | Freq: Once | INTRAVENOUS | Status: AC
  Administered 2015-11-08: 500 mL via INTRAVENOUS

## 2015-11-08 MED ORDER — CEFAZOLIN SODIUM-DEXTROSE 2-4 GM/100ML-% IV SOLN
INTRAVENOUS | Status: AC
  Filled 2015-11-08: qty 100

## 2015-11-08 MED ORDER — SIMETHICONE 80 MG PO CHEW
80.0000 mg | CHEWABLE_TABLET | ORAL | Status: DC | PRN
Start: 2015-11-08 — End: 2015-11-11

## 2015-11-08 MED ORDER — ONDANSETRON HCL 4 MG/2ML IJ SOLN: 4.0000 mg | Freq: Three times a day (TID) | INTRAMUSCULAR | Status: DC | PRN

## 2015-11-08 MED ORDER — CEFAZOLIN SODIUM-DEXTROSE 2-3 GM-% IV SOLR
INTRAVENOUS | Status: DC | PRN
  Administered 2015-11-08: 2 g via INTRAVENOUS

## 2015-11-08 MED ORDER — METOCLOPRAMIDE HCL 5 MG/ML IJ SOLN
INTRAMUSCULAR | Status: DC | PRN
  Administered 2015-11-08: 10 mg via INTRAVENOUS

## 2015-11-08 MED ORDER — DIPHENHYDRAMINE HCL 25 MG PO CAPS
25.0000 mg | ORAL_CAPSULE | ORAL | Status: DC | PRN
  Filled 2015-11-08: qty 1

## 2015-11-08 MED ORDER — ACETAMINOPHEN 500 MG PO TABS
1000.0000 mg | ORAL_TABLET | Freq: Four times a day (QID) | ORAL | Status: AC
  Administered 2015-11-08: 1000 mg via ORAL
  Filled 2015-11-08 (×2): qty 2

## 2015-11-08 MED ORDER — FENTANYL 2.5 MCG/ML BUPIVACAINE 1/10 % EPIDURAL INFUSION (WH - ANES)
14.0000 mL/h | INTRAMUSCULAR | Status: DC | PRN
  Administered 2015-11-08: 14 mL/h via EPIDURAL
  Filled 2015-11-08: qty 125

## 2015-11-08 MED ORDER — MENTHOL 3 MG MT LOZG: 1.0000 | LOZENGE | OROMUCOSAL | Status: DC | PRN

## 2015-11-08 MED ORDER — ONDANSETRON HCL 4 MG/2ML IJ SOLN
INTRAMUSCULAR | Status: AC
  Filled 2015-11-08: qty 2

## 2015-11-08 MED ORDER — LACTATED RINGERS IV SOLN
INTRAVENOUS | Status: DC
  Administered 2015-11-08 (×2): via INTRAVENOUS

## 2015-11-08 MED ORDER — OXYTOCIN 40 UNITS IN LACTATED RINGERS INFUSION - SIMPLE MED: 2.5000 [IU]/h | INTRAVENOUS | Status: DC

## 2015-11-08 MED ORDER — LACTATED RINGERS IV SOLN
INTRAVENOUS | Status: DC
  Administered 2015-11-08: 800 mL via INTRAVENOUS

## 2015-11-08 MED ORDER — ONDANSETRON HCL 4 MG/2ML IJ SOLN
INTRAMUSCULAR | Status: DC | PRN
  Administered 2015-11-08: 4 mg via INTRAVENOUS

## 2015-11-08 MED ORDER — PHENYLEPHRINE 40 MCG/ML (10ML) SYRINGE FOR IV PUSH (FOR BLOOD PRESSURE SUPPORT)
PREFILLED_SYRINGE | INTRAVENOUS | Status: AC
  Filled 2015-11-08: qty 10

## 2015-11-08 MED ORDER — TERBUTALINE SULFATE 1 MG/ML IJ SOLN
0.2500 mg | Freq: Once | INTRAMUSCULAR | Status: DC | PRN
Start: 2015-11-08 — End: 2015-11-08

## 2015-11-08 MED ORDER — METOCLOPRAMIDE HCL 5 MG/ML IJ SOLN
INTRAMUSCULAR | Status: AC
  Filled 2015-11-08: qty 2

## 2015-11-08 MED ORDER — SODIUM CHLORIDE 0.9 % IR SOLN
Status: DC | PRN
  Administered 2015-11-08: 1

## 2015-11-08 MED ORDER — KETOROLAC TROMETHAMINE 30 MG/ML IJ SOLN: 30.0000 mg | Freq: Four times a day (QID) | INTRAMUSCULAR | Status: DC | PRN

## 2015-11-08 MED ORDER — OXYTOCIN 10 UNIT/ML IJ SOLN
INTRAMUSCULAR | Status: AC
  Filled 2015-11-08: qty 4

## 2015-11-08 MED ORDER — SIMETHICONE 80 MG PO CHEW
80.0000 mg | CHEWABLE_TABLET | Freq: Three times a day (TID) | ORAL | Status: DC
  Administered 2015-11-08 – 2015-11-11 (×8): 80 mg via ORAL
  Filled 2015-11-08 (×8): qty 1

## 2015-11-08 MED ORDER — SODIUM BICARBONATE 8.4 % IV SOLN
INTRAVENOUS | Status: AC
  Filled 2015-11-08: qty 50

## 2015-11-08 MED ORDER — SCOPOLAMINE 1 MG/3DAYS TD PT72
MEDICATED_PATCH | TRANSDERMAL | Status: DC | PRN
  Administered 2015-11-08: 1 via TRANSDERMAL

## 2015-11-08 MED ORDER — TETANUS-DIPHTH-ACELL PERTUSSIS 5-2.5-18.5 LF-MCG/0.5 IM SUSP: 0.5000 mL | Freq: Once | INTRAMUSCULAR | Status: DC

## 2015-11-08 MED ORDER — WITCH HAZEL-GLYCERIN EX PADS: 1.0000 "application " | MEDICATED_PAD | CUTANEOUS | Status: DC | PRN

## 2015-11-08 MED ORDER — SCOPOLAMINE 1 MG/3DAYS TD PT72
MEDICATED_PATCH | TRANSDERMAL | Status: AC
  Filled 2015-11-08: qty 1

## 2015-11-08 MED ORDER — LIDOCAINE-EPINEPHRINE (PF) 2 %-1:200000 IJ SOLN
INTRAMUSCULAR | Status: DC | PRN
  Administered 2015-11-08 (×2): 5 mL via EPIDURAL
  Administered 2015-11-08: 6 mL via EPIDURAL

## 2015-11-08 MED ORDER — PRENATAL MULTIVITAMIN CH: 1.0000 | ORAL_TABLET | Freq: Every day | ORAL | Status: DC

## 2015-11-08 MED ORDER — SENNOSIDES-DOCUSATE SODIUM 8.6-50 MG PO TABS
2.0000 | ORAL_TABLET | ORAL | Status: DC
  Administered 2015-11-08 – 2015-11-11 (×3): 2 via ORAL
  Filled 2015-11-08 (×3): qty 2

## 2015-11-08 MED ORDER — ZOLPIDEM TARTRATE 5 MG PO TABS: 5.0000 mg | ORAL_TABLET | Freq: Every evening | ORAL | Status: DC | PRN

## 2015-11-08 MED ORDER — LIDOCAINE HCL (PF) 1 % IJ SOLN
INTRAMUSCULAR | Status: DC | PRN
  Administered 2015-11-08 (×2): 6 mL

## 2015-11-08 MED ORDER — DIPHENHYDRAMINE HCL 25 MG PO CAPS: 25.0000 mg | ORAL_CAPSULE | Freq: Four times a day (QID) | ORAL | Status: DC | PRN

## 2015-11-08 MED ORDER — LACTATED RINGERS IV SOLN
500.0000 mL | INTRAVENOUS | Status: DC | PRN
  Administered 2015-11-08 (×2): 500 mL via INTRAVENOUS

## 2015-11-08 MED ORDER — ACETAMINOPHEN 325 MG PO TABS
650.0000 mg | ORAL_TABLET | ORAL | Status: DC | PRN
  Administered 2015-11-09: 650 mg via ORAL
  Filled 2015-11-08 (×2): qty 2

## 2015-11-08 MED ORDER — PROCHLORPERAZINE EDISYLATE 5 MG/ML IJ SOLN: 10.0000 mg | Freq: Once | INTRAMUSCULAR | Status: DC | PRN

## 2015-11-08 MED ORDER — OXYTOCIN 40 UNITS IN LACTATED RINGERS INFUSION - SIMPLE MED
1.0000 m[IU]/min | INTRAVENOUS | Status: DC
  Administered 2015-11-08: 2 m[IU]/min via INTRAVENOUS
  Filled 2015-11-08: qty 1000

## 2015-11-08 MED ORDER — PHENYLEPHRINE 40 MCG/ML (10ML) SYRINGE FOR IV PUSH (FOR BLOOD PRESSURE SUPPORT): 80.0000 ug | PREFILLED_SYRINGE | INTRAVENOUS | Status: DC | PRN

## 2015-11-08 SURGICAL SUPPLY — 35 items
CLAMP CORD UMBIL (MISCELLANEOUS) IMPLANT
CLOTH BEACON ORANGE TIMEOUT ST (SAFETY) ×3 IMPLANT
DRSG OPSITE POSTOP 4X10 (GAUZE/BANDAGES/DRESSINGS) ×3 IMPLANT
DURAPREP 26ML APPLICATOR (WOUND CARE) ×6 IMPLANT
ELECT REM PT RETURN 9FT ADLT (ELECTROSURGICAL) ×3
ELECTRODE REM PT RTRN 9FT ADLT (ELECTROSURGICAL) ×1 IMPLANT
EXTRACTOR VACUUM BELL STYLE (SUCTIONS) IMPLANT
GLOVE BIOGEL PI IND STRL 7.0 (GLOVE) ×1 IMPLANT
GLOVE BIOGEL PI IND STRL 8 (GLOVE) ×1 IMPLANT
GLOVE BIOGEL PI INDICATOR 7.0 (GLOVE) ×2
GLOVE BIOGEL PI INDICATOR 8 (GLOVE) ×2
GLOVE ECLIPSE 8.0 STRL XLNG CF (GLOVE) ×3 IMPLANT
GOWN STRL REUS W/TWL LRG LVL3 (GOWN DISPOSABLE) ×6 IMPLANT
KIT ABG SYR 3ML LUER SLIP (SYRINGE) ×3 IMPLANT
LIQUID BAND (GAUZE/BANDAGES/DRESSINGS) ×3 IMPLANT
NEEDLE HYPO 18GX1.5 BLUNT FILL (NEEDLE) ×3 IMPLANT
NEEDLE HYPO 22GX1.5 SAFETY (NEEDLE) ×3 IMPLANT
NEEDLE HYPO 25X5/8 SAFETYGLIDE (NEEDLE) ×3 IMPLANT
NS IRRIG 1000ML POUR BTL (IV SOLUTION) ×3 IMPLANT
PACK C SECTION WH (CUSTOM PROCEDURE TRAY) ×3 IMPLANT
PAD OB MATERNITY 4.3X12.25 (PERSONAL CARE ITEMS) ×3 IMPLANT
PENCIL SMOKE EVAC W/HOLSTER (ELECTROSURGICAL) ×3 IMPLANT
RTRCTR C-SECT PINK 25CM LRG (MISCELLANEOUS) IMPLANT
SUT CHROMIC 0 CT 1 (SUTURE) ×3 IMPLANT
SUT MNCRL 0 VIOLET CTX 36 (SUTURE) ×2 IMPLANT
SUT MONOCRYL 0 CTX 36 (SUTURE) ×4
SUT PLAIN 2 0 (SUTURE)
SUT PLAIN 2 0 XLH (SUTURE) IMPLANT
SUT PLAIN ABS 2-0 CT1 27XMFL (SUTURE) IMPLANT
SUT VIC AB 0 CTX 36 (SUTURE) ×2
SUT VIC AB 0 CTX36XBRD ANBCTRL (SUTURE) ×1 IMPLANT
SUT VIC AB 4-0 KS 27 (SUTURE) IMPLANT
SYR 20CC LL (SYRINGE) ×6 IMPLANT
TOWEL OR 17X24 6PK STRL BLUE (TOWEL DISPOSABLE) ×3 IMPLANT
TRAY FOLEY CATH SILVER 14FR (SET/KITS/TRAYS/PACK) IMPLANT

## 2015-11-08 NOTE — Progress Notes (Signed)
Kristi [name] is a 25 y.o. G1P0000 at 7497w0d by ultrasound admitted for induction of labor due to Gestational diabetes.  Subjective:   Objective: BP 108/65 mmHg  Pulse 100  Temp(Src) 98.1 F (36.7 C) (Axillary)  Resp 20  Ht 5\' 2"  (1.575 m)  Wt 155 lb (70.308 kg)  BMI 28.34 kg/m2  LMP 02/01/2015 (Approximate)      FHT:  FHR: 150's bpm, variability: moderate,  accelerations:  Present,  decelerations:  Absent UC:   regular, every 3-5 minutes SVE:   Dilation: 4 Effacement (%): 80 Station: -1 Exam by:: Kristi [employer]Leann [name]  [name]: Lab Results  Component Value Date   WBC 10.4 11/08/2015   HGB 11.7* 11/08/2015   HCT 35.5* 11/08/2015   MCV 64.5* 11/08/2015   PLT 155 11/08/2015    Assessment / Plan: Induction of labor due to gestational diabetes,  progressing well on pitocin  Labor: Progressing normally Preeclampsia:  no signs or symptoms of toxicity and intake and ouput balanced Fetal Wellbeing:  Category I Pain Control:  Epidural I/D:  n/a Anticipated MOD:  NSVD  Kristi [name] 11/08/2015, 5:33 AM

## 2015-11-08 NOTE — Anesthesia Postprocedure Evaluation (Signed)
Anesthesia Post Note  Patient: Kristi [name]  Procedure(s) Performed: Procedure(s) (LRB): CESAREAN SECTION (N/A)  Patient location during evaluation: Mother Baby Anesthesia Type: Epidural Level of consciousness: awake and alert Pain management: pain level controlled Vital Signs Assessment: post-procedure vital signs reviewed and stable Respiratory status: spontaneous breathing, nonlabored ventilation and respiratory function stable Cardiovascular status: stable Postop Assessment: no headache, no backache and epidural receding Anesthetic complications: no     Last Vitals:  Filed Vitals:   11/08/15 1100 11/08/15 1200  BP: 103/56 116/62  Pulse: 65 71  Temp: 36.8 C 36.7 C  Resp: 18 18    Last Pain:  Filed Vitals:   11/08/15 1308  PainSc: 0-No pain   Pain Goal: Patients Stated Pain Goal: 3 (11/08/15 0855)               Junious SilkGILBERT,Mirely Pangle

## 2015-11-08 NOTE — Lactation Note (Addendum)
This note was copied from a baby's chart. Lactation Consultation Note Offered mother interpreter but mother states she did not need interpretation. P1, Baby 6 hours old.  Reviewed hand expression with mother and she easily expressed drops. Latched baby in laid back  cradle position by compressing breast. Sucks and some swallows observed.  Observed feeding for more than 10 min. Taught mother how to [place] and massage breast to keep baby active. Reviewed basics including supply and demand, cluster feeding. Mom encouraged to feed baby 8-12 times/24 hours and with feeding cues.  Mom made aware of O/P services, breastfeeding support groups, community resources, and our phone # for post-discharge questions.    Patient Name: Kristi [name]: 11/08/2015 Reason for consult: Initial assessment   Maternal Data Has patient been taught Hand Expression?: Yes Does the patient have breastfeeding experience prior to this delivery?: No  Feeding Feeding Type: Breast Fed Length of feed: 5 min  LATCH Score/Interventions Latch: Grasps breast easily, tongue down, lips flanged, rhythmical sucking.  Audible Swallowing: A few with stimulation Intervention(s): Skin to skin;Hand expression;Alternate breast massage  Type of Nipple: Everted at rest and after stimulation  Comfort (Breast/Nipple): Soft / non-tender     Hold (Positioning): Assistance needed to correctly position infant at breast and maintain latch.  LATCH Score: 8  Lactation Tools Discussed/Used     Consult Status Consult Status: Follow-up [name]: 11/09/15 Follow-up type: In-patient    [name], [name] 11/08/2015, 1:58 PM

## 2015-11-08 NOTE — Anesthesia Postprocedure Evaluation (Signed)
Anesthesia Post Note  Patient: Kristi [name]  Procedure(s) Performed: Procedure(s) (LRB): CESAREAN SECTION (N/A)  Patient location during evaluation: PACU Anesthesia Type: Spinal and MAC Level of consciousness: awake and alert Pain management: pain level controlled Vital Signs Assessment: post-procedure vital signs reviewed and stable Respiratory status: spontaneous breathing and respiratory function stable Cardiovascular status: blood pressure returned to baseline and stable Postop Assessment: spinal receding Anesthetic complications: no     Last Vitals:  Filed Vitals:   11/08/15 0800 11/08/15 0815  BP: 79/54 96/68  Pulse: 89 79  Temp:    Resp: 18 18    Last Pain:  Filed Vitals:   11/08/15 0822  PainSc: Asleep   Pain Goal: Patients Stated Pain Goal: 2 (11/08/15 0424)               Heather RobertsSINGER,Donel Osowski DANIEL

## 2015-11-08 NOTE — Transfer of Care (Signed)
Immediate Anesthesia Transfer of Care Note  Patient: Kristi [name]  Procedure(s) Performed: Procedure(s): CESAREAN SECTION (N/A)  Patient Location: PACU  Anesthesia Type:Epidural  Level of Consciousness: awake, alert , oriented and patient cooperative  Airway & Oxygen Therapy: Patient Spontanous Breathing  Post-op Assessment: Report given to RN and Post -op Vital signs reviewed and stable  Post vital signs: Reviewed and stable  Last Vitals:  Filed Vitals:   11/08/15 0625 11/08/15 0630  BP: 109/74 122/86  Pulse: 92 102  Temp:    Resp: 18 18    Last Pain:  Filed Vitals:   11/08/15 0640  PainSc: 0-No pain      Patients Stated Pain Goal: 2 (11/08/15 0424)  Complications: No apparent anesthesia complications

## 2015-11-08 NOTE — Consult Note (Signed)
Neonatology Note:   Attendance at C-section:    I was asked by Dr. Eure to attend this emergent C/S at term for fetal bradycardia. The mother is a G1, GBS negative with good prenatal care complicated by GDM.  ROM 0 hours before delivery, fluid clear. Infant vigorous with good spontaneous cry and tone. Needed only minimal bulb suctioning. Ap 8/9. Lungs clear to ausc in DR. To CN to care of Pediatrician.  David C. Ehrmann, MD  

## 2015-11-08 NOTE — Anesthesia Procedure Notes (Signed)
Epidural Patient location during procedure: OB  Staffing Anesthesiologist: Markie Frith  Preanesthetic Checklist Completed: patient identified, site marked, surgical consent, pre-op evaluation, timeout performed, IV checked, risks and benefits discussed and monitors and equipment checked  Epidural Patient position: sitting Prep: DuraPrep Patient monitoring: heart rate and blood pressure Approach: midline Location: L3-L4 Injection technique: LOR saline  Needle:  Needle type: Tuohy  Needle gauge: 17 G Needle length: 9 cm Needle insertion depth: 5 cm Catheter type: closed end flexible Catheter size: 19 Gauge Catheter at skin depth: 10 cm Test dose: negative and Other  Assessment Events: blood not aspirated, injection not painful, no injection resistance, negative IV test and no paresthesia  Additional Notes Reason for block:procedure for pain   

## 2015-11-08 NOTE — Op Note (Signed)
Preoperative diagnosis:  1.  Intrauterine pregnancy at 6623w0d  weeks gestation                                         2.  Gestational diabetes                                         3.  Fetal bradycardia, without resolution   Postoperative diagnosis:  Same as above   Procedure:  Primary cesarean section  Surgeon:  Lazaro ArmsLuther H Aquan Kope MD  Assistant:    Anesthesia: Epidural  Findings:  The heart rate dropped into the 50s and 60s abruptly from a normal fetal heart rate had stayed down for approximately 8 minutes we change positions multiple times during that timeframe and were able to get the heart rate back and to around 100 but it dropped down into the 90s and back and the 80s again is a 13 minutes of the heart rate being down proceeded with a code cesarean.  We were able to use her epidural for effective anesthesia.  Most notably there was no cord which was vulnerable no nuchal no prolapse and there was no fetal placental abruption noted  Over a low transverse incision was delivered a viable female with Apgars of 8 and 9 weighing  lbs. pending oz. Uterus, tubes and ovaries were all normal.  There were no other significant findings  Description of operation:  Patient was taken to the operating room and placed in the sitting position where she underwent a spinal anesthetic. She was then placed in the supine position with tilt to the left side. When adequate anesthetic level was obtained she was prepped and draped in usual sterile fashion and a Foley catheter was placed. A Pfannenstiel skin incision was made and carried down sharply to the rectus fascia which was scored in the midline extended laterally. The fascia was taken off the muscles both superiorly and without difficulty. The muscles were divided.  The peritoneal cavity was entered.  Bladder blade was placed, no bladder flap was created.  A low transverse hysterotomy incision was made and delivered a viable female  infant at [phone] with Apgars of 8 and  9 weighing lbs  oz.  Cord pH was obtained and was pending. The uterus was exteriorized. It was closed in 2 layers, the first being a running interlocking layer and the second being an imbricating layer using 0 monocryl on a CTX needle. There was good resulting hemostasis. The uterus tubes and ovaries were all normal. Peritoneal cavity was irrigated vigorously. The muscles and peritoneum were reapproximated loosely. The fascia was closed using 0 Vicryl in running fashion. Subcutaneous tissue was made hemostatic and irrigated. The skin was closed using 4-0 Vicryl on a Keith needle in a subcuticular fashion.  Dermabond was placed for additional wound integrity and to serve as a barrier. Blood loss for the procedure was 500 cc. The patient received a gram of Ancef prophylactically. The patient was taken to the recovery room in good stable condition with all counts being correct x3.  EBL 500 cc  Jahziah Simonin H 11/08/2015 7:06 AM

## 2015-11-08 NOTE — Anesthesia Preprocedure Evaluation (Addendum)
Anesthesia Evaluation  Patient identified by MRN, date of birth, ID band Patient awake    Reviewed: Allergy & Precautions, NPO status , Patient's Chart, lab work & pertinent test results  Airway Mallampati: II  TM Distance: >3 FB Neck ROM: Full    Dental no notable dental hx.    Pulmonary neg pulmonary ROS,    Pulmonary exam normal breath sounds clear to auscultation       Cardiovascular negative cardio ROS Normal cardiovascular exam Rhythm:Regular Rate:Normal     Neuro/Psych negative neurological ROS  negative psych ROS   GI/Hepatic negative GI ROS, Neg liver ROS,   Endo/Other  diabetes, Gestational  Renal/GU Renal disease  negative genitourinary   Musculoskeletal negative musculoskeletal ROS (+)   Abdominal   Peds negative pediatric ROS (+)  Hematology negative hematology ROS (+)   Anesthesia Other Findings   Reproductive/Obstetrics negative OB ROS                           Anesthesia Physical Anesthesia Plan  ASA: II  Anesthesia Plan: Epidural   Post-op Pain Management:    Induction: Intravenous  Airway Management Planned: Natural Airway  Additional Equipment:   Intra-op Plan:   Post-operative Plan:   Informed Consent: I have reviewed the patients History and Physical, chart, labs and discussed the procedure including the risks, benefits and alternatives for the proposed anesthesia with the patient or authorized representative who has indicated his/her understanding and acceptance.   Dental advisory given  Plan Discussed with: CRNA  Anesthesia Plan Comments: (Informed consent obtained prior to proceeding including risk of failure, 1% risk of PDPH, risk of minor discomfort and bruising.  Discussed rare but serious complications including epidural abscess, permanent nerve injury, epidural hematoma.  Discussed alternatives to epidural analgesia and patient desires to  proceed.  Timeout performed pre-procedure verifying patient name, procedure, and platelet count.  Patient tolerated procedure well.  (Discussed through professional interpreter on phone. Questions answered)  [phone]: stat C/S called by Dr. Despina HiddenEure. Epidural has been working well. Will use epidural for C/S. )      Anesthesia Quick Evaluation

## 2015-11-08 NOTE — Addendum Note (Signed)
Addendum  created 11/08/15 1332 by Junious SilkMelinda Sayyid Harewood, CRNA   Modules edited: Charges VN, Clinical Notes   Clinical Notes:  File: [id number]

## 2015-11-08 NOTE — H&P (Signed)
Kristi [name] is a 25 y.o. female G1 @ 40.0 wks [place] Councilpresenting for IOL for GDM. Maternal Medical History:  Fetal activity: Perceived fetal activity is normal.   Last perceived fetal movement was within the past hour.    Prenatal complications: no prenatal complications Prenatal Complications - Diabetes: gestational.    OB History    Gravida Para Term Preterm AB TAB SAB Ectopic Multiple Living   1 0 0 0 0 0 0 0 0 0      Past Medical History  Diagnosis Date  . Kidney stones 2015  . Gestational diabetes    Past Surgical History  Procedure Laterality Date  . No past surgeries     Family History: family history includes Cancer in her mother; Diabetes in her mother; Heart disease in her mother; Hypertension in her father. Social History:  reports that she has never smoked. She has never used smokeless tobacco. She reports that she does not drink alcohol or use illicit drugs.   Prenatal Transfer Tool  Maternal Diabetes: No Genetic Screening: Normal Maternal Ultrasounds/Referrals: Normal Fetal Ultrasounds or other Referrals:  None Maternal Substance Abuse:  No Significant Maternal Medications:  None Significant Maternal Lab Results:  None Other Comments:  diet control GDM  Review of Systems  Constitutional: Negative.   HENT: Negative.   Eyes: Negative.   Respiratory: Negative.   Cardiovascular: Negative.   Gastrointestinal: Negative.   Genitourinary: Negative.   Musculoskeletal: Negative.   Skin: Negative.   Neurological: Negative.   Endo/Heme/Allergies: Negative.   Psychiatric/Behavioral: Negative.     Dilation: 4 Effacement (%): 80 Station: -1 Exam by:: Leann Banks Blood pressure 108/65, pulse 100, temperature 98.1 F (36.7 C), temperature source Axillary, resp. rate 20, height 5\' 2"  (1.575 m), weight 155 lb (70.308 kg), last menstrual period 02/01/2015. Maternal Exam:  Uterine Assessment: Contraction strength is moderate.  Contraction frequency is regular.    Abdomen: Patient reports no abdominal tenderness. Fetal presentation: vertex  Introitus: Normal vulva. Normal vagina.  Amniotic fluid character: not assessed.  Pelvis: adequate for delivery.   Cervix: Cervix evaluated by digital exam.     Fetal Exam Fetal Monitor Review: Mode: ultrasound.   Variability: moderate (6-25 bpm).   Pattern: accelerations present and no decelerations.    Fetal State Assessment: Category I - tracings are normal.     Physical Exam  Constitutional: She is oriented to person, place, and time. She appears well-developed and well-nourished.  HENT:  Head: Normocephalic.  Eyes: Pupils are equal, round, and reactive to light.  Neck: Normal range of motion.  Cardiovascular: Normal rate, regular rhythm, normal heart sounds and intact distal pulses.   Respiratory: Effort normal and breath sounds normal.  GI: Soft. Bowel sounds are normal.  Genitourinary: Vagina normal and uterus normal.  Musculoskeletal: Normal range of motion.  Neurological: She is alert and oriented to person, place, and time. She has normal reflexes.  Skin: Skin is warm and dry.  Psychiatric: She has a normal mood and affect. Her behavior is normal. Judgment and thought content normal.    Prenatal labs: ABO, Rh: --/--/O POS, O POS (05/15 0135) Antibody: NEG (05/15 0135) Rubella: Immune (11/07 0000) RPR: NON REAC (03/27 1041)  HBsAg: Negative (11/07 0000)  HIV: NONREACTIVE (03/27 1041)  GBS: Negative (04/17 0000)   Assessment/Plan: SVE 1/th/post/high. GBS neg, Will start cytotec   Kristi [name] 11/08/2015, 5:16 AM

## 2015-11-09 ENCOUNTER — Encounter (HOSPITAL_COMMUNITY): Payer: Self-pay | Admitting: Obstetrics & Gynecology

## 2015-11-09 LAB — CBC
HEMATOCRIT: 26.3 % — AB (ref 36.0–46.0)
Hemoglobin: 8.5 g/dL — ABNORMAL LOW (ref 12.0–15.0)
MCH: 21.2 pg — ABNORMAL LOW (ref 26.0–34.0)
MCHC: 32.3 g/dL (ref 30.0–36.0)
MCV: 65.6 fL — AB (ref 78.0–100.0)
Platelets: 136 10*3/uL — ABNORMAL LOW (ref 150–400)
RBC: 4.01 MIL/uL (ref 3.87–5.11)
RDW: 15.2 % (ref 11.5–15.5)
WBC: 11.3 10*3/uL — AB (ref 4.0–10.5)

## 2015-11-09 LAB — RPR: RPR Ser Ql: NONREACTIVE

## 2015-11-09 LAB — GLUCOSE, CAPILLARY: Glucose-Capillary: 81 mg/dL (ref 65–99)

## 2015-11-09 LAB — RUBELLA SCREEN: Rubella: 4.88 index (ref >0.99)

## 2015-11-09 MED ORDER — FERROUS SULFATE 325 (65 FE) MG PO TABS
325.0000 mg | ORAL_TABLET | Freq: Every day | ORAL | Status: DC
  Administered 2015-11-09 – 2015-11-11 (×3): 325 mg via ORAL
  Filled 2015-11-09 (×3): qty 1

## 2015-11-09 MED ORDER — OXYCODONE-ACETAMINOPHEN 5-325 MG PO TABS
1.0000 | ORAL_TABLET | ORAL | Status: DC | PRN
  Administered 2015-11-09 – 2015-11-10 (×2): 1 via ORAL
  Filled 2015-11-09 (×2): qty 1

## 2015-11-09 MED ORDER — OXYCODONE-ACETAMINOPHEN 5-325 MG PO TABS: 2.0000 | ORAL_TABLET | ORAL | Status: DC | PRN

## 2015-11-09 NOTE — Progress Notes (Signed)
POSTPARTUM PROGRESS NOTE  Post Partum Day 1 Subjective:  Kristi [name] is a 25 y.o. G1P1001 7033w0d s/p pltcs.  No acute events overnight.  Pt denies problems with ambulating, voiding or po intake.  She denies nausea or vomiting.  Pain is moderately controlled.  She has had flatus. She has not had bowel movement.  Lochia Small.   Objective: Blood pressure 86/55, pulse 77, temperature 98.3 F (36.8 C), temperature source Oral, resp. rate 18, height 5\' 2"  (1.575 m), weight 155 lb (70.308 kg), last menstrual period 02/01/2015, SpO2 98 %, unknown if currently breastfeeding.  Physical Exam:  General: alert, cooperative and no distress Lochia:normal flow Chest: CTAB Heart: RRR no m/r/g Abdomen: +BS, soft, nontender, incision c/d/i Uterine Fundus: firm,  DVT Evaluation: No calf swelling or tenderness Extremities: trace edema   Recent Labs  11/08/15 0135 11/09/15 0612  HGB 11.7* 8.5*  HCT 35.5* 26.3*    Assessment/Plan:  ASSESSMENT: Kristi [name] is a 25 y.o. G1P1001 433w0d s/p pltcs, doing well. Will start iron for h 8.5. Fasting cbg appropriate (81)  Plan for discharge tomorrow   LOS: 1 day   Silvano Bilisoah B Deyja Sochacki 11/09/2015, 6:57 AM

## 2015-11-09 NOTE — Addendum Note (Signed)
Addendum  created 11/09/15 1628 by Sherrian DiversBruce Jasun Gasparini, MD   Modules edited: Anesthesia Attestations, Anesthesia Events, Anesthesia Responsible Staff, Narrator   Narrator:  Narrator: Event Log Edited

## 2015-11-09 NOTE — Progress Notes (Signed)
RN used [employer] [phone] to educate mom regarding blood draw in the am- Serum bili and PKU. Rn also informed mom about the Congenital Heart screen.

## 2015-11-09 NOTE — Progress Notes (Signed)
Called Dr. Ashok PallWouk regarding patient's bp of 82/42 and heart rate of 72. Bleeding wnl. No further orders given.

## 2015-11-09 NOTE — Progress Notes (Signed)
RN used [employer] Interpreter. Pt states she understands RN questions and does not need interpreter line most times but will let RN knows if she needs interpreter.  

## 2015-11-09 NOTE — Lactation Note (Signed)
This note was copied from a baby's chart. Lactation Consultation Note Follow up visit at 33 hours of age.  LC requested by AD, Kristi LynnLinda [name], mom is requesting a bottle of formula.  Chart indicated 8 feedings with last LATCH of "9" 3 voids and 3 stools.  Mom denies need for interpreter.  Mom does not have support person and reports feedings are hard for her during the night.  Discussed risks of formula feeding and artificial nipples.  Encouraged exclusively breastfeeding and feed on demand to establish a good supply.  Mom came in with plan to do both breast and formula.  Mom reports last feeding about 2 hours ago.  LC encouraged mom to undress baby for STS and offer feeding.  Mom reports she doesn't have milk after compressing her breast twice.  LC demonstrated breast massage and hand expression with several drops noted from left breast.  Right breast noted fuller on lateral underside of breast, but maybe edema more than milk filling.  Mom does not have a bra to wear here at the hospital.  Only a glisten of colostrum noted from right breast.  Mom attempted a reclined position for latching baby who was not opening his mouth and starting to show some feeding cues.  LC assisted with more of a cross cradle hold.  Baby latched well with assist.  Strong rhythmic sucking for several minutes and then needed stimulation to maintain total feeding of 20 minutes. When baby became sleepy, LC unlatched baby and nipple noted to be compressed on tip with slight clear blister noted.  Reviewed use of pillows and keeping baby close to breast as baby may have been slipping off when not staying awake for feeding.  Encouraged mom to independently latch baby and mom is unable at this time and needs hands on assist. Baby latched deeply with intermittent sucking with stimulation for several more minutes.  Nipple again appear compressed, but mom denies pain.  Expressed colostrum applied to nipple.  This finding warrants oral assessment of baby  who is asleep at this time and will be deferred to later.  Mom to call for assist as needed.  LC reported to Kristi Carolinn, Kristi [name].         Patient Name: [name] ZOXWR'UToday's Date: 11/09/2015 Reason for consult: Follow-up assessment   Maternal Data Has patient been taught Hand Expression?: Yes Does the patient have breastfeeding experience prior to this delivery?: No  Feeding Feeding Type: Breast Fed Length of feed: 20 min  LATCH Score/Interventions Latch: Repeated attempts needed to sustain latch, nipple held in mouth throughout feeding, stimulation needed to elicit sucking reflex. Intervention(s): Adjust position;Assist with latch;Breast massage;Breast compression  Audible Swallowing: A few with stimulation Intervention(s): Skin to skin;Hand expression;Alternate breast massage  Type of Nipple: Everted at rest and after stimulation  Comfort (Breast/Nipple): Soft / non-tender (edema noted to right breast on lateral underside of breast)  Interventions (Mild/moderate discomfort): Hand expression;Hand massage  Hold (Positioning): Assistance needed to correctly position infant at breast and maintain latch. Intervention(s): Breastfeeding basics reviewed;Support Pillows;Position options;Skin to skin  LATCH Score: 7  Lactation Tools Discussed/Used WIC Program: Yes   Consult Status Consult Status: Follow-up Date: 11/10/15 Follow-up type: In-patient    Kristi [name], Kristi [name] 11/09/2015, 4:29 PM

## 2015-11-10 NOTE — Progress Notes (Signed)
Post Partum Day 2/POD#2 Subjective:  Kristi [name] is a 25 y.o. G1P1001 1422w0d s/p pLTCS for fetal indications.  No acute events overnight.  Pt denies problems with ambulating, voiding or po intake.  She denies nausea or vomiting.  Pain is well controlled.  She has had flatus.  Lochia Minimal.  Plan for birth control is Nexplanon.  Method of Feeding: Breast/Bottle, needing lactation support  Objective: Blood pressure 96/53, pulse 70, temperature 97.7 F (36.5 C), temperature source Oral, resp. rate 18, height 5\' 2"  (1.575 m), weight 155 lb (70.308 kg), last menstrual period 02/01/2015, SpO2 98 %, unknown if currently breastfeeding.  Physical Exam:  General: alert, cooperative and no distress Lochia:normal flow Chest: normal WOB Heart: Regular rate Abdomen: +BS, soft, mild TTP (appropriate) Uterine Fundus: firm Incision: c/d/i DVT Evaluation: No evidence of DVT seen on physical exam. Extremities: No edema   Recent Labs  11/08/15 0135 11/09/15 0612  HGB 11.7* 8.5*  HCT 35.5* 26.3*    Assessment/Plan:  ASSESSMENT: Carolene L Goltz is a 25 y.o. G1P1001 7122w0d s/p pLTCS  Plan for discharge tomorrow and Lactation consult Continue routine PP care Breastfeeding support PRN  LOS: 2 days   Federico FlakeKimberly Niles Wonda Goodgame 11/10/2015, 9:52 AM

## 2015-11-10 NOTE — Progress Notes (Signed)
Interrupter Number [id number] used for education and plan of care.

## 2015-11-10 NOTE — Lactation Note (Signed)
This note was copied from a baby's chart. Lactation Consultation Note  Patient Name: Boy Rosette Revealhuong Pallo ZOXWR'UToday's Date: 11/10/2015 Reason for consult: Follow-up assessment Baby at 57 hr of life and having trouble with latch. Mom has been offering bottles of formula today and has not been pumping. She stated she wants to bf but she does not have any milk. Both breast are firm, large drops of transitional milk easily expressed, she denies breast or nipple pain. Baby opens mouth wide, has a high, almost bubble palate, thin, tight lingual frenulum, he does cup tongue, he can extend to gum ridge but not past gum ridge, he can lift lateral edges of tongue to midline, and he has some lateralization of tongue. Applied #24 NS. Baby latched after several attempts. He was able to to maintain short bursts of sucking then fell asleep. With some stimulation he would wake and continue eating. Each time baby come off the breast a large amount of  Milk was noted in the NS. Mom wants help latching and waking baby. Encouraged her to bf on her own. If she is not comfortable latching baby she can use the DEBP, she did not seem any more confidant with the pump. Discussed engorgement prevention/treatment, feeding frequency, and nipple care. She is aware of lactation services and support group. She will call as needed. She will latch baby 8+/24 hr on demand, pump as needed, and decrease formula use.      Maternal Data    Feeding Feeding Type: Breast Fed Nipple Type: Slow - flow Length of feed: 20 min (off and on per mother)  LATCH Score/Interventions Latch: Repeated attempts needed to sustain latch, nipple held in mouth throughout feeding, stimulation needed to elicit sucking reflex. Intervention(s): Adjust position;Assist with latch;Breast compression  Audible Swallowing: Spontaneous and intermittent Intervention(s): Alternate breast massage;Skin to skin  Type of Nipple: Everted at rest and after stimulation  Comfort  (Breast/Nipple): Soft / non-tender     Hold (Positioning): Full assist, staff holds infant at breast Intervention(s): Position options;Support Pillows  LATCH Score: 7  Lactation Tools Discussed/Used Tools: Nipple Shields Nipple shield size: 24   Consult Status Consult Status: Follow-up Date: 11/11/15 Follow-up type: In-patient    Rulon Eisenmengerlizabeth E Cinda Hara 11/10/2015, 4:03 PM

## 2015-11-11 MED ORDER — OXYCODONE-ACETAMINOPHEN 5-325 MG PO TABS: 1.0000 | ORAL_TABLET | ORAL | Status: DC | PRN

## 2015-11-11 MED ORDER — OXYCODONE-ACETAMINOPHEN 5-325 MG PO TABS: 1.0000 | ORAL_TABLET | Freq: Four times a day (QID) | ORAL | Status: DC | PRN

## 2015-11-11 MED ORDER — IBUPROFEN 600 MG PO TABS: 600.0000 mg | ORAL_TABLET | Freq: Four times a day (QID) | ORAL | Status: DC

## 2015-11-11 NOTE — Lactation Note (Signed)
This note was copied from a baby's chart. Lactation Consultation Note  Patient Name: Kristi [name] Revealhuong Dekoning ZOXWR'UToday's Date: 11/11/2015 Reason for consult: Follow-up assessment  Baby is 6176 hour old,  [facility]  Mom trying to latch the baby , breast are both full to boarder line  Engorged , LC assisted with mom to hand express off the 1st breast , areola compressible for  #24 NS to fit well. Baby latched well with multiply swallows, increased with breast compressions.  Baby released after 30 mins, and acting still hingry , re-latched on the same breast / football position  Without the NS and baby latched with depth , multiply swallows, increased with breast compressions.  Breast softened down well.  LC recommended both breast for 15 -20 mins , and then pump for 10 -15 mins to soften breast completely [place] [facility] [place] and LC recommended to call for a DEBP loaner for a strong plan B, also due to using the  NS . LC [facility]..  Sore nipple and engorgement prevention and tx reviewed . Mom has a Hand pump and a DEBP set up.  LC offered mom a LC F/U for next week and mom declined for now due to transportation challenges.  [facility]   Maternal Data Has patient been taught Hand Expression?: Yes  Feeding Feeding Type: Breast Fed (latched without the NS / right breast / football ) Length of feed: 15 min  LATCH Score/Interventions Latch: Grasps breast easily, tongue down, lips flanged, rhythmical sucking. Intervention(s): Skin to skin;Teach feeding cues;Waking techniques Intervention(s): Adjust position;Assist with latch;Breast massage;Breast compression  Audible Swallowing: Spontaneous and intermittent Intervention(s): Alternate breast massage;Hand expression;Skin to skin  Type of Nipple: Everted at rest and after stimulation  Comfort (Breast/Nipple): Filling, red/small blisters or  bruises, mild/mod discomfort (resolving )  Problem noted: Filling Interventions  (Cracked/bleeding/bruising/blister): Hand pump  Hold (Positioning): Assistance needed to correctly position infant at breast and maintain latch. Intervention(s): Breastfeeding basics reviewed;Support Pillows;Position options;Skin to skin  LATCH Score: 8  Lactation Tools Discussed/Used Tools: Pump;Nipple Shields Nipple shield size: 24 Breast pump type: Manual WIC Program: Yes (LC faxing a DEBP WIC pump referral ) Pump Review: Setup, frequency, and cleaning   Consult Status Consult Status: Follow-up Date: 11/11/15 Follow-up type: In-patient    Kathrin Greathouseorio, Esperanza Madrazo Ann 11/11/2015, 12:16 PM

## 2015-11-11 NOTE — Discharge Summary (Signed)
OB Discharge Summary     Patient Name: Kristi [name] DOB: 1990/08/10 MRN: [id number]  Date of admission: 11/08/2015 Delivering MD: Duane Lope H   Date of discharge: 11/11/2015  Admitting diagnosis: INDUCTION Intrauterine pregnancy: [redacted]w[redacted]d     Secondary diagnosis:  Active Problems:   Gestational diabetes   Term pregnancy   S/P cesarean section  Additional problems: none     Discharge diagnosis: GDM A1                                                                                              Post partum procedures:none  Augmentation: Pitocin  Complications: None  Hospital course:  Onset of Labor With Unplanned C/S for NRFHT  25 y.o. yo G1P1001 at [redacted]w[redacted]d was admitted for IOL for A1GDM on 11/08/2015. Patient had a labor course significant for NRFHT leading to pLTCS. Membrane Rupture Time/Date: 6:39 AM ,11/08/2015   The patient went for cesarean section due to Non-Reassuring FHR, and delivered a Viable infant,11/08/2015  Details of operation can be found in separate operative note. Patient had an uncomplicated postpartum course.  She is ambulating,tolerating a regular diet, passing flatus, and urinating well.  Patient is discharged home in stable condition 11/11/2015.  A1GDM: blood glucose within normal range after delivery. Recommned GTT at follow up. Physical exam  Filed Vitals:   11/09/15 1800 11/10/15 0522 11/10/15 1710 11/11/15 0604  BP: 95/42 96/53 103/59 111/68  Pulse: 78 70 72 80  Temp: 98.1 F (36.7 C) 97.7 F (36.5 C) 97.8 F (36.6 C) 98.2 F (36.8 C)  TempSrc: Oral Oral Oral Oral  Resp: Height:      Weight:      SpO2:       General: alert, cooperative and no distress Lochia: appropriate Uterine Fundus: firm Incision: Dressing is clean, dry, and intact DVT Evaluation: No evidence of DVT seen on physical exam. Labs: Lab Results  Component Value Date   WBC 11.3* 11/09/2015   HGB 8.5* 11/09/2015   HCT 26.3* 11/09/2015   MCV 65.6* 11/09/2015   PLT 136* 11/09/2015   No flowsheet data found.  Discharge instruction: per After Visit Summary and "Baby and Me Booklet".  After visit meds:    Medication List    TAKE these medications        accu-chek softclix lancets  New DX GDM O24.419 for testing 4 times daily     glucose blood test strip  Commonly known as:  ACCU-CHEK AVIVA PLUS  New DX GDM O24.419 for testing 4 times daily     ibuprofen 600 MG tablet  Commonly known as:  ADVIL,MOTRIN  Take 1 tablet (600 mg total) by mouth every 6 (six) hours.     oxyCODONE-acetaminophen 5-325 MG tablet  Commonly known as:  PERCOCET/ROXICET  Take 1 tablet by mouth every 4 (four) hours as needed (Pain >4).     prenatal multivitamin Tabs tablet  Take 1 tablet by mouth daily at 12 noon.        Diet: routine diet  Activity: Advance as tolerated. Pelvic rest for 6 weeks.  Outpatient follow up:6 weeks Follow up Appt:No future appointments. Follow up Visit:No Follow-up on file.  Postpartum contraception: Nexplanon  Newborn Data: Live born female  Birth Weight: 6 lb 15.3 oz (3155 g) APGAR: 8, 9  Baby Feeding: Breast Disposition:home with mother   11/11/2015 Almon Herculesaye T Gonfa, MD   CNM attestation I have seen and examined this patient and agree with above documentation in the resident's note.   Kristi [name] is a 25 y.o. G1P1001 s/p pLTCS.   Pain is well controlled.  Plan for birth control is Nexplanon.  Method of Feeding: breast  PE:  BP 111/68 mmHg  Pulse 80  Temp(Src) 98.2 F (36.8 C) (Oral)  Resp 18  Ht 5\' 2"  (1.575 m)  Wt 70.308 kg (155 lb)  BMI 28.34 kg/m2  SpO2 98%  LMP 02/01/2015 (Approximate)  Breastfeeding? Unknown Fundus firm  No results for input(s): HGB, HCT in the last 72 hours.   Plan: discharge today - postpartum care discussed - f/u clinic in 6 weeks for postpartum visit   Persephanie Laatsch, CNM 3:30 PM

## 2015-11-11 NOTE — Discharge Instructions (Signed)
Sinh N? qua m ??o, Ch?m [place] Sau kh Sinh (Vaginal Delivery, Care After) Tham kh?o t? thng tin ny trong vi tu?n t?i. Nh?ng h??ng d?n xu?t vi?n ny cung c?p cho b?n thng tin v? ch?m [place] b?n thn sau khi sinh. Chuyn gia ch?m [place] s?c kh?e c?ng c th? cung c?p cho b?n cc h??ng d?n c? th?. ?i?u tr? c?a b?n ? ???c ln k? ho?ch theo th?c hnh y khoa m?i nh?t s?n c, nh?ng v?n ?? ?i khi v?n x?y ra. Hy g?i cho chuyn gia ch?m [place] s?c kh?e c?a mnh n?u b?n c b?t k? v?n ?? ho?c cu h?i no sau khi v? nh. H??NG D?N CH?M [place] T?I NH  Ch? s? d?ng thu?c khng c?n k toa ho?c thu?c c?n k toa theo ch? d?n c?a chuyn gia ch?m [place] s?c kh?e ho?c d??c s?.  Khng u?ng r??u ??c bi?t khi b?n ?ang nui con b ho?c u?ng thu?c ?? gi?m ?au.  Khng nhai thu?c l ho?c ht thu?c.  Khng s? d?ng ma ty b?t h?p php.  Ti?p t?c ch?m [place] t?t vng ?y ch?u. Ch?m [place] t?t vng ?y ch?u bao g?m:  Lau ?y ch?u c?a b?n t? tr??c ra sau.  Gi? ?y ch?u c?a b?n s?ch s?Maggie Schwalbe s? d?ng b?ng v? sinh ho?c th?t r?a m ??o cho ??n khi chuyn gia ch?m [place] s?c kh?e cho php.  T?m, g?i ??u v t?m b?n theo ch? d?n c?a chuyn gia ch?m [place] s?c kh?e.  M?c o ng?c v?a v?n h? tr? ng?c.  ?n th?c ph?m c l?i cho s?c kh?e.  U?ng ?? n??c ?? gi? cho n??c ti?u trong ho?c vng nh?t.  ?n cc lo?i th?c ph?m nhi?u ch?t x? nh? ng? c?c nguyn h?t, bnh m, g?o l?c, cc lo?i ??u, tri cy t??i v rau qu? m?i ngy. Nh?ng th?c ph?m ny c th? gip ng?n ng?a ho?c lm gi?m to bn.  Lm hi?n theo khuy?n ngh? c?a chuyn gia ch?m [place] s?c kh?e v? vi?c b?t ??u l?i cc ho?t ??ng nh? leo c?u thang, li xe, nng, t?p th? d?c ho?c ?i l?i.  Ni chuy?n v?i chuyn gia ch?m [place] s?c kh?e v? vi?c b?t ??u l?i ho?t ??ng tnh d?c. Vi?c b?t ??u l?i ho?t ??ng tnh d?c ty thu?c vo nguy c? nhi?m trng, m?c ?? lnh b?nh, c?m gic tho?i mi v mong mu?n ti?p t?c ho?t ??ng tnh d?c c?a b?n.  C? g?ng ?? c ai ? gip b?n cc cng vi?c n?i tr? v tr? m?i sinh cho  t nh?t m?t vi ngy sau khi xu?t vi?n.  Ngh? ng?i cng nhi?u cng t?t. C? g?ng ngh? ng?i ho?c c gi?c ng? ng?n khi tr? m?i sinh ?ang ng?Marland Kitchen  T?ng ho?t ??ng d?n d?n.  Tun th? m?i cu?c h?n lin quan ngay sau khi sinh ? ???c s?p x?p l?ch. ?i?u r?t quan tr?ng l ph?i gi? m?i cu?c h?n khm l?i ? ???c s?p x?p l?ch. T?i cc cu?c h?n ny, chuyn gia ch?m [place] s?c kh?e s? ki?m tra ?? ??m b?o r?ng b?n ?ang h?i ph?c v? m?t th? ch?t v tnh c?m. HY ?I KHM N?U:  m ??o cho ra cc c?c mu ?ng l?n. Gi? l?i m?i c?c mu ?ng ?? chuyn gia ch?m [place] s?c kh?e xem.  D?ch m ??o c mi hi.  B?n g?p v?n ?? khi ti?u ti?n.  B?n ?i ti?u th??ng xuyn.  B?n b? ?au khi ?i ti?u.  C s? thay ??i  trong ??i ti?n.  B? t?y ??, ?au ho?c s?ng g?n v?t r?ch m ??o (c?t t?ng sinh mn) hay v?t rch m ??o.  C m? ch?y ra t? v?t c?t t?ng sinh mn ho?c v?t rch m ??o.  V?t c?t t?ng sinh mn ho?c v?t rch m ??o h mi?ng.  Ng?c b? ?au, c?ng ho?c ??.  B?n b? ?au ??u r?t nhi?u.  M?t b?n b? m? ho?c nhn th?y cc v?t l?m ??m.  B?n c?m th?y bu?n ho?c chn n?n.  B?n c suy ngh? v? vi?c t? lm t?n th??ng mnh ho?c tr? m?i sinh.  B?n c cu h?i v? vi?c ch?m [place] b?n thn, ch?m [place] tr? m?i sinh ho?c v? thu?c.  B?n b? chng m?t ho?c ?au ??u.  B?n b? pht ban.  B?n b? bu?n nn ho?c nn m?a.  B?n ? cho con b v ch?a c kinh nguy?t trong vng 12 tu?n sau khi ng?ng cho con b.  B?n khng cho con b v ch?a c kinh nguy?t trong kho?ng 12 tu?n sau khi sinh.  B?n b? s?t. HY NGAY L?P T?C ?I KHM N?U:  B?n b? ?au dai d?ng.  B?n b? ?au ng?c.  B?n b? kh th?.  B?n b? ng?t.  B?n b? ?au chn.  B?n b? ?au d? dy.  Ch?y mu m ??o th?m ??m hai mi?ng dn v? sinh tr? ln trong 1 ti?ng.   Thng tin ny khng nh?m m?c ?ch thay th? cho l?i khuyn m chuyn gia ch?m [place] s?c kh?e ni v?i qu v?. Hy b?o ??m qu v? ph?i th?o lu?n b?t k? v?n ?? g m qu v? c v?i chuyn gia ch?m [place] s?c kh?e c?a qu v?.   Document  Released: 06/12/2005 Document Revised: 03/03/2015 Elsevier Interactive Patient Education 2016 Elsevier Inc. Iron-Rich Diet  Iron is a mineral that helps your body to produce hemoglobin. Hemoglobin is a protein in your red blood cells that carries oxygen to your body's tissues. Eating too little iron may cause you to feel weak and tired, and it can increase your risk for infection. Eating enough iron is necessary for your body's metabolism, muscle function, and nervous system. Iron is naturally found in many foods. It can also be added to foods or fortified in foods. There are two types of dietary iron:  Heme iron. Heme iron is absorbed by the body more easily than nonheme iron. Heme iron is found in meat, poultry, and fish.  Nonheme iron. Nonheme iron is found in dietary supplements, iron-fortified grains, beans, and vegetables. You may need to follow an iron-rich diet if:  You have been diagnosed with iron deficiency or iron-deficiency anemia.  You have a condition that prevents you from absorbing dietary iron, such as:  Infection in your intestines.  Celiac disease. This involves long-lasting (chronic) inflammation of your intestines.  You do not eat enough iron.  You eat a diet that is high in foods that impair iron absorption.  You have lost a lot of blood.  You have heavy bleeding during your menstrual cycle.  You are pregnant. WHAT IS MY PLAN? Your health care provider may help you to determine how much iron you need per day based on your condition. Generally, when a person consumes sufficient amounts of iron in the diet, the following iron needs are met:  Men.  46-21 years old: 11 mg per day.  58-5 years old: 8 mg per day.  Women.   73-71 years old: 15 mg per day.  48-37 years old: 18 mg per day.  Over 7 years old: 8 mg per day.  Pregnant women: 27 mg per day.  Breastfeeding women: 9 mg per day. WHAT DO I NEED TO KNOW ABOUT AN IRON-RICH DIET?  Eat fresh  fruits and vegetables that are high in vitamin C along with foods that are high in iron. This will help increase the amount of iron that your body absorbs from food, especially with foods containing nonheme iron. Foods that are high in vitamin C include oranges, peppers, tomatoes, and mango.  Take iron supplements only as directed by your health care provider. Overdose of iron can be life-threatening. If you were prescribed iron supplements, take them with orange juice or a vitamin C supplement.  Cook foods in pots and pans that are made from iron.   Eat nonheme iron-containing foods alongside foods that are high in heme iron. This helps to improve your iron absorption.   Certain foods and drinks contain compounds that impair iron absorption. Avoid eating these foods in the same meal as iron-rich foods or with iron supplements. These include:  Coffee, black tea, and red wine.  Milk, dairy products, and foods that are high in calcium.  Beans, soybeans, and peas.  Whole grains.  When eating foods that contain both nonheme iron and compounds that impair iron absorption, follow these tips to absorb iron better.   Soak beans overnight before cooking.  Soak whole grains overnight and drain them before using.  Ferment flours before baking, such as using yeast in bread dough. WHAT FOODS CAN I EAT? Grains Iron-fortified breakfast cereal. Iron-fortified whole-wheat bread. Enriched rice. Sprouted grains. Vegetables Spinach. Potatoes with skin. Green peas. Broccoli. Red and green bell peppers. Fermented vegetables. Fruits Prunes. Raisins. Oranges. Strawberries. Mango. Grapefruit. Meats and Other Protein Sources Beef liver. Oysters. Beef. Shrimp. [place]. Chicken. [place]. Sardines. Chickpeas. Nuts. Tofu. Beverages Tomato juice. Fresh orange juice. Prune juice. Hibiscus tea. Fortified instant breakfast shakes. Condiments Tahini. Fermented soy sauce. Sweets and Desserts Black-strap  molasses.  Other Wheat germ. The items listed above may not be a complete list of recommended foods or beverages. Contact your dietitian for more options. WHAT FOODS ARE NOT RECOMMENDED? Grains Whole grains. Bran cereal. Bran flour. Oats. Vegetables Artichokes. Brussels sprouts. Kale. Fruits Blueberries. Raspberries. Strawberries. Figs. Meats and Other Protein Sources Soybeans. Products made from soy protein. Dairy Milk. Cream. Cheese. Yogurt. Cottage cheese. Beverages Coffee. Black tea. Red wine. Sweets and Desserts Cocoa. Chocolate. Ice cream. Other Basil. Oregano. Parsley. The items listed above may not be a complete list of foods and beverages to avoid. Contact your dietitian for more information.   This information is not intended to replace advice given to you by your health care provider. Make sure you discuss any questions you have with your health care provider.   Document Released: 01/24/2005 Document Revised: 07/03/2014 Document Reviewed: 01/07/2014 Elsevier Interactive Patient Education [employer].

## 2015-12-22 ENCOUNTER — Ambulatory Visit (INDEPENDENT_AMBULATORY_CARE_PROVIDER_SITE_OTHER): Payer: Medicaid Other | Admitting: Obstetrics & Gynecology

## 2015-12-22 ENCOUNTER — Encounter: Payer: Self-pay | Admitting: Obstetrics & Gynecology

## 2015-12-22 DIAGNOSIS — Z3202 Encounter for pregnancy test, result negative: Secondary | ICD-10-CM | POA: Diagnosis not present

## 2015-12-22 DIAGNOSIS — Z30017 Encounter for initial prescription of implantable subdermal contraceptive: Secondary | ICD-10-CM

## 2015-12-22 DIAGNOSIS — Z308 Encounter for other contraceptive management: Secondary | ICD-10-CM

## 2015-12-22 DIAGNOSIS — Z98891 History of uterine scar from previous surgery: Secondary | ICD-10-CM

## 2015-12-22 LAB — POCT PREGNANCY, URINE: PREG TEST UR: NEGATIVE

## 2015-12-22 MED ORDER — ETONOGESTREL 68 MG ~~LOC~~ IMPL
68.0000 mg | DRUG_IMPLANT | Freq: Once | SUBCUTANEOUS | Status: AC
  Administered 2015-12-22: 68 mg via SUBCUTANEOUS

## 2015-12-22 MED ORDER — ETONOGESTREL 68 MG ~~LOC~~ IMPL: 1.0000 | DRUG_IMPLANT | Freq: Once | SUBCUTANEOUS | Status: DC

## 2015-12-22 NOTE — Progress Notes (Signed)
     Subjective:     Kristi [name] is a 25 y.o. 371P1001 female who presents for a postpartum visit. [place] interpreter used. She is 6 weeks postpartum following a LTCS at 5676w0d for bradycardia in the setting of IOL for A1GDM. I have fully reviewed the prenatal and intrapartum course.  Postpartum course has been unremarkable. Baby's course has been unremarkable. Baby is feeding by bottle. Bleeding no bleeding. Bowel function is normal. Bladder function is normal. Patient is not sexually active. Contraception method is none, desires Nexplanon Postpartum depression screening: negative.  The following portions of the patient's history were reviewed and updated as appropriate: allergies, current medications, past family history, past medical history, past social history, past surgical history and problem list.  Review of Systems Pertinent items noted in HPI and remainder of comprehensive ROS otherwise negative.   Objective:   AFVSS  General:  alert and no distress   Breasts:  deferred  Lungs: clear to auscultation bilaterally  Heart:  regular rate and rhythm  Abdomen: soft, non-tender; bowel sounds normal; no masses,  no organomegaly and incision is C/D/I and well-healed   Pelvic:  not evaluated      Nexplanon Insertion Procedure Patient identified, informed consent performed, consent signed.   Patient does understand that irregular bleeding is a very common side effect of this medication. She was advised to have backup contraception for one week after placement. Pregnancy test in clinic today was negative.  Appropriate time out taken.  Patient's left arm was prepped and draped in the usual sterile fashion. The ruler used to measure and mark insertion area.  Patient was prepped with alcohol swab and then injected with 3 ml of 1% lidocaine.  She was prepped with betadine, Nexplanon removed from packaging,  Device confirmed in needle, then inserted full length of needle and withdrawn per handbook  instructions. Nexplanon was able to palpated in the patient's arm; patient palpated the insert herself. There was minimal blood loss.  Patient insertion site covered with guaze and a pressure bandage to reduce any bruising.  The patient tolerated the procedure well and was given post procedure instructions.     Assessment:   Normal postpartum exam. Nexplanon inserted. H/O A1GDM   Plan:   1. Contraception: Nexplanon. Backup contraception suggested for one week. Will be in place for three years. 2. Follow up as needed and for 2 hr GTT, told to come in fasting status.  Jaynie CollinsUGONNA  Manpreet Kemmer, MD, FACOG Attending Obstetrician & Gynecologist, [facility]

## 2015-12-22 NOTE — Addendum Note (Signed)
Addended by: Garret ReddishBARNES, Norine Reddington M on: 12/22/2015 03:42 PM   Modules accepted: Orders

## 2015-12-22 NOTE — Patient Instructions (Signed)
Return to clinic for any scheduled appointments or for any gynecologic concerns as needed.   

## 2015-12-30 ENCOUNTER — Other Ambulatory Visit: Payer: Medicaid Other

## 2015-12-30 DIAGNOSIS — O99814 Abnormal glucose complicating childbirth: Secondary | ICD-10-CM

## 2015-12-31 LAB — GLUCOSE TOLERANCE, 2 HOURS
Glucose, 2 hour: 107 mg/dL (ref <140)
Glucose, Fasting: 83 mg/dL (ref 65–99)

## 2016-01-04 ENCOUNTER — Encounter: Payer: Self-pay | Admitting: *Deleted

## 2016-10-11 IMAGING — US US MFM OB FOLLOW UP
1 series · 14 of 28 positions shown · non-contrast
Comparison: none

[Series 1: us mfm ob follow up · 55 acquisitions, 14 frames shown]
[im 3/55]
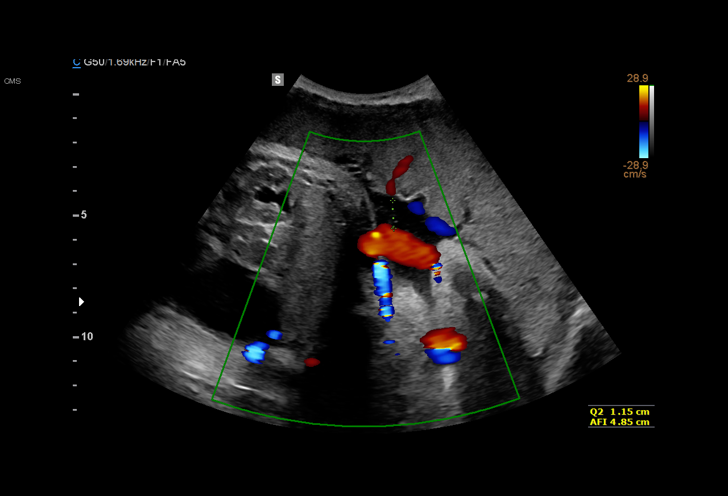
[im 7/55]
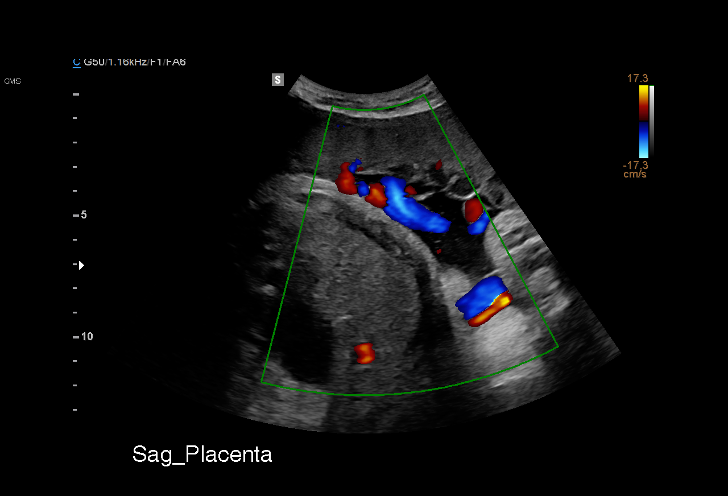
[im 11/55]
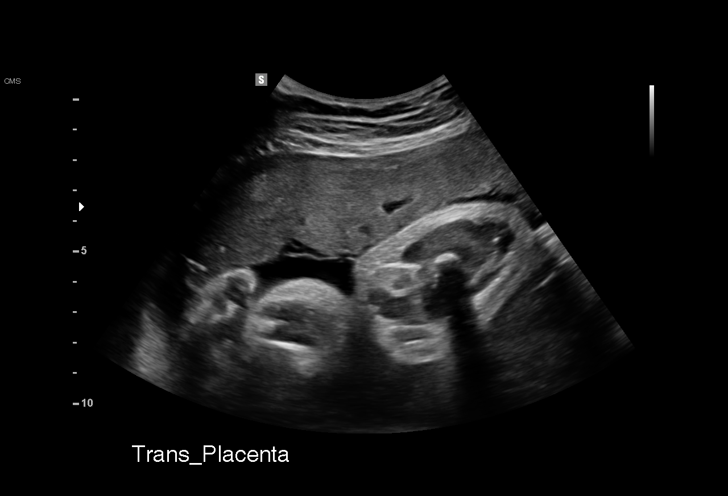
[im 15/55]
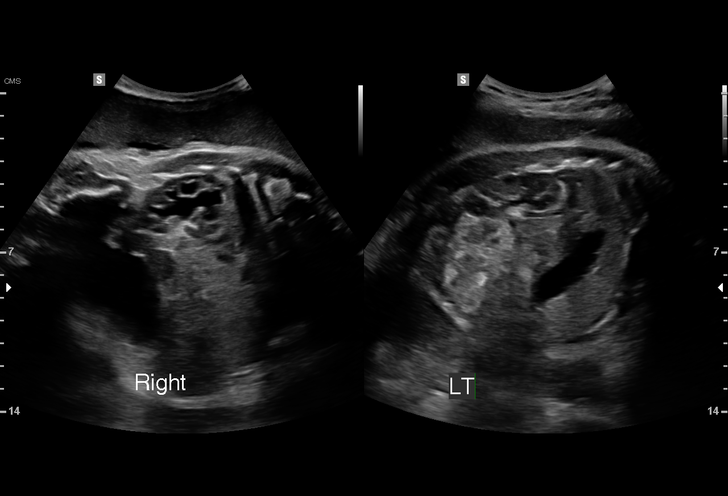
[im 19/55]
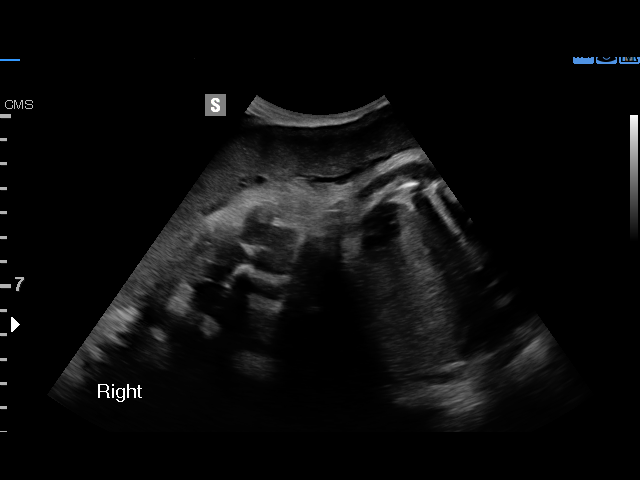
[im 23/55]
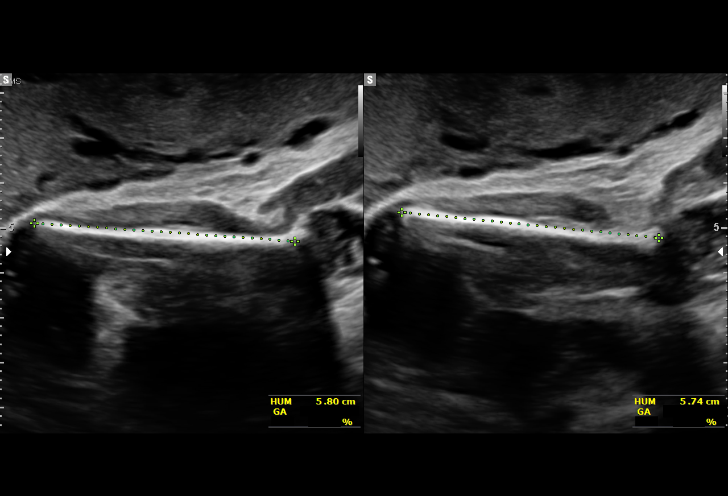
[im 27/55]
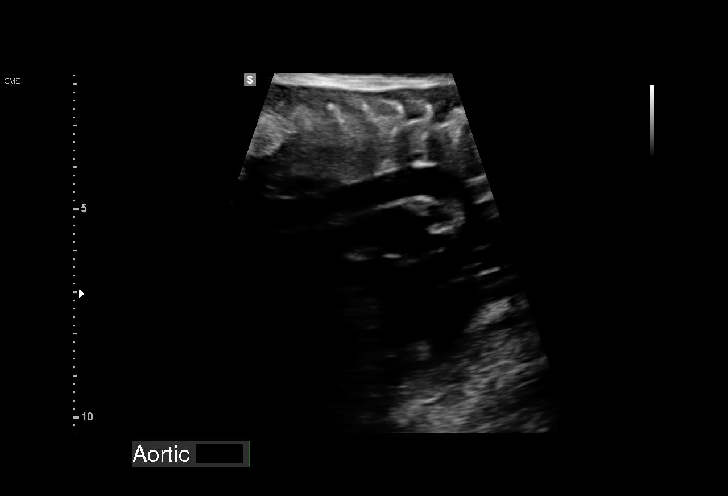
[im 31/55]
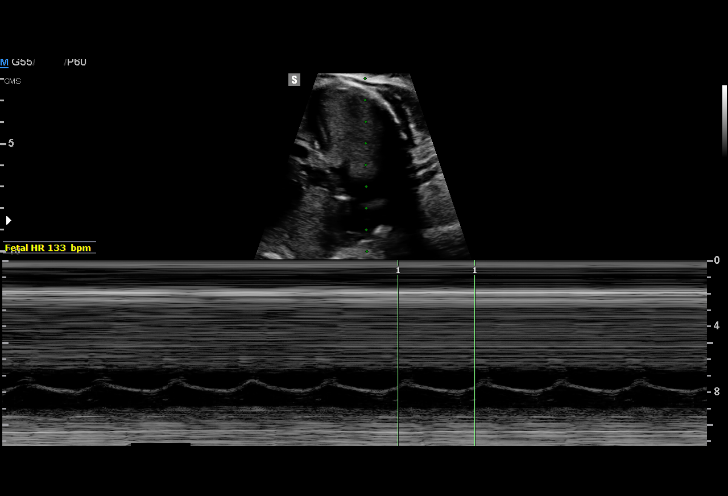
[im 35/55]
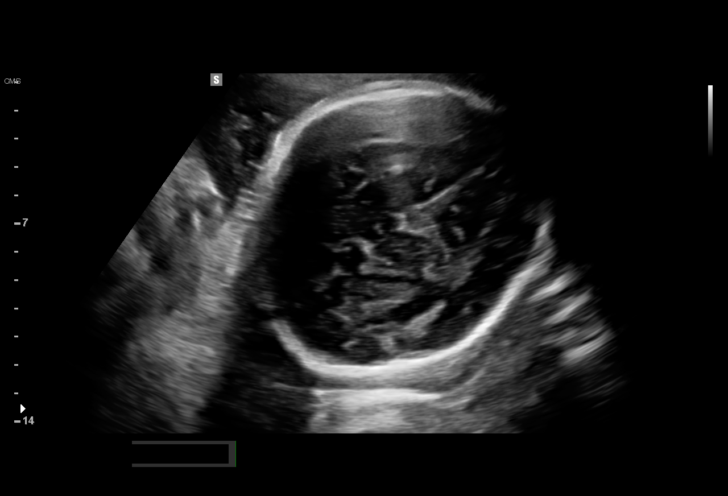
[im 39/55]
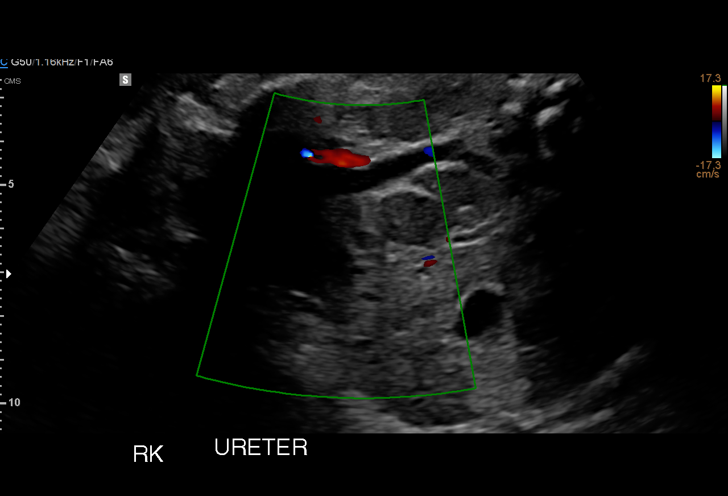
[im 43/55]
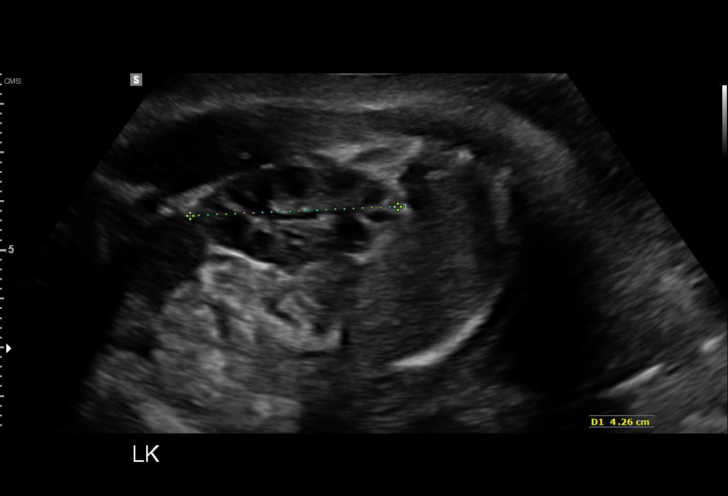
[im 47/55]
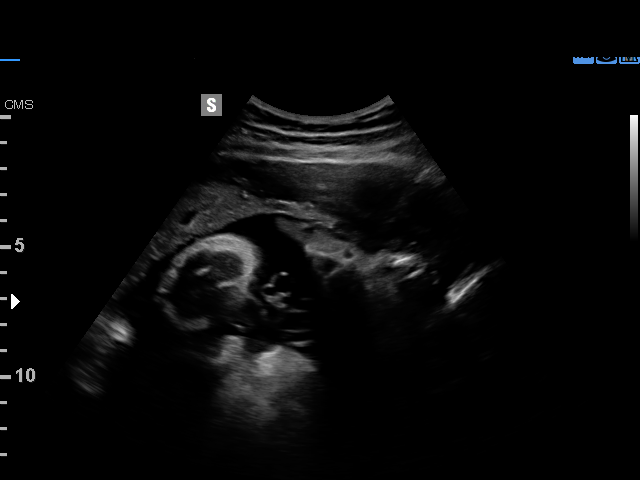
[im 51/55]
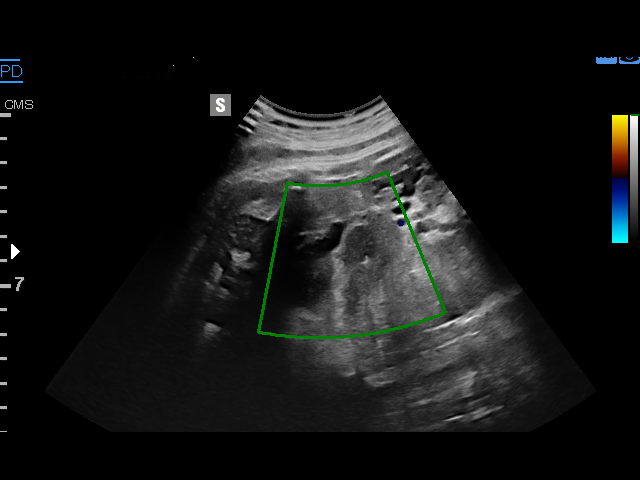
[im 55/55]
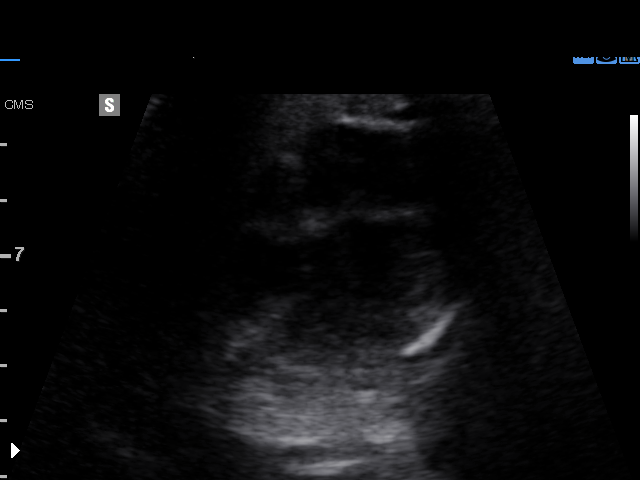

[14 of 28 positions shown; findings below may reference images not displayed]

1  MIKHAIL FIALLO           [id number]      [id number]     [id number]
Indications

38 weeks gestation of pregnancy
Gestational diabetes in pregnancy, diet
controlled
OB History

Blood Type:            Height:  5'0"   Weight (lb):  148      BMI:
Fetal Evaluation

Num Of Fetuses:     1
Fetal Heart         133
Rate(bpm):
Cardiac Activity:   Observed
Presentation:       Cephalic
Placenta:           Anterior, above cervical os
P. Cord Insertion:  Visualized, central

Amniotic Fluid
AFI FV:      Subjectively within normal limits

AFI Sum(cm)     %Tile       Largest Pocket(cm)
10.1            27

RUQ(cm)       RLQ(cm)       LUQ(cm)        LLQ(cm)
3.7
Biometry

BPD:      95.3  mm     G. Age:  38w 6d         88  %    CI:        80.23   %   70 - 86
FL/HC:      19.4   %   20.9 -
HC:      336.1  mm     G. Age:  38w 4d         40  %    HC/AC:      0.92       0.92 -
AC:       367   mm     G. Age:  40w 4d       > 97  %    FL/BPD:     68.3   %   71 - 87
FL:       65.1  mm     G. Age:  33w 4d        < 3  %    FL/AC:      17.7   %   20 - 24
HUM:      57.7  mm     G. Age:  33w 3d        < 5  %

Est. FW:    8590  gm    7 lb 14 oz      88  %
Gestational Age

LMP:           38w 1d       Date:   02/01/15                 EDD:   11/08/15
U/S Today:     37w 6d                                        EDD:   11/10/15
Best:          38w 1d    Det. By:   LMP  (02/01/15)          EDD:   11/08/15
Anatomy

Cranium:               Appears normal         Aortic Arch:            Appears normal
Cavum:                 Appears normal         Ductal Arch:            Appears normal
Ventricles:            Previously seen        Diaphragm:              Previously seen
Choroid Plexus:        Appears normal         Stomach:                Appears normal, left
sided
Cerebellum:            Previously seen        Abdomen:                Appears normal
Posterior Fossa:       Not well visualized    Abdominal Wall:         Not well visualized
Nuchal Fold:           Not applicable (>20    Cord Vessels:           Previously seen
wks GA)
Face:                  Orbits previously      Kidneys:                Right UTD A2
seen
Lips:                  Appears normal         Bladder:                Appears normal
Thoracic:              Appears normal         Spine:                  Previously seen
Heart:                 Previously seen        Upper Extremities:      Previously seen
RVOT:                  Appears normal         Lower Extremities:      Previously seen
LVOT:                  Appears normal

Other:  Male gender. Heels and 5th digit previously visualized. Nasal bone
previously visualized. Technically difficult due to advanced gestational
age.
Cervix Uterus Adnexa

Cervix
Not visualized (advanced GA >67wks)

Uterus
No abnormality visualized.

Left Ovary
Not visualized.

Right Ovary
Not visualized.

Cul De Sac:   No free fluid seen.

Adnexa:       No abnormality visualized.
Impression

SIUP at 38+1 weeks
UTD A2 on right: dilated pelvis ([DATE] mms), central and
peripheral calyceal dilation and hydroureter; normal renal
parenchyma; findings c/w partial UVJ obstruction, reflux or
ureterocele (not visualized); left kidney normal; bladder
normal
All other interval fetal anatomy was seen and appeared
normal
Normal amniotic fluid volume
EFW at the 88th %tile; AC > 97th %tile

The US findings were shared with Ms. Pudlet. The implications
of the renal anomaly were discussed in detail.
Recommendations

Postnatal evaluation of right kidney
Most likely will need prophylactic antibiotics and referral to a
pediatric urologist as an outpt
Follow-up as clinically indicated
Routine prenatal care, labor and delivery

## 2018-04-18 ENCOUNTER — Encounter: Payer: Self-pay | Admitting: *Deleted

## 2018-10-24 ENCOUNTER — Encounter: Payer: Self-pay | Admitting: *Deleted

## 2019-04-29 LAB — OB RESULTS CONSOLE HEPATITIS B SURFACE ANTIGEN: Hepatitis B Surface Ag: NEGATIVE

## 2019-04-29 LAB — OB RESULTS CONSOLE HIV ANTIBODY (ROUTINE TESTING): HIV: NONREACTIVE

## 2019-04-29 LAB — OB RESULTS CONSOLE HGB/HCT, BLOOD
HCT: 35 (ref 29–41)
Hemoglobin: 11.2

## 2019-04-29 LAB — OB RESULTS CONSOLE RPR: RPR: NONREACTIVE

## 2019-04-29 LAB — OB RESULTS CONSOLE PLATELET COUNT: Platelets: 266

## 2019-04-29 LAB — HEPATITIS C ANTIBODY: HCV Ab: NEGATIVE

## 2019-04-29 LAB — GLUCOSE TOLERANCE, 1 HOUR: Glucose, 1 Hour GTT: 74

## 2019-04-29 LAB — OB RESULTS CONSOLE ANTIBODY SCREEN: Antibody Screen: NEGATIVE

## 2019-04-29 LAB — OB RESULTS CONSOLE RUBELLA ANTIBODY, IGM: Rubella: IMMUNE

## 2019-04-30 LAB — OB RESULTS CONSOLE GC/CHLAMYDIA
Chlamydia: NEGATIVE
Gonorrhea: NEGATIVE

## 2019-05-05 DIAGNOSIS — O34219 Maternal care for unspecified type scar from previous cesarean delivery: Secondary | ICD-10-CM | POA: Insufficient documentation

## 2019-05-05 LAB — CYSTIC FIBROSIS DIAGNOSTIC STUDY: Interpretation-CFDNA:: NEGATIVE

## 2019-08-25 LAB — GLUCOSE TOLERANCE, 1 HOUR: Glucose, 1 Hour GTT: 188

## 2019-08-25 LAB — OB RESULTS CONSOLE HGB/HCT, BLOOD
HCT: 36 (ref 29–41)
Hemoglobin: 11.1

## 2019-08-25 LAB — OB RESULTS CONSOLE RPR: RPR: NONREACTIVE

## 2019-08-26 LAB — GLUCOSE TOLERANCE, 3 HOURS
Glucose 1 Hour: 198
Glucose 2 Hour: 190
Glucose 3 Hour: 143
Glucose Fasting: 85

## 2019-09-04 ENCOUNTER — Encounter: Payer: Self-pay | Admitting: Obstetrics & Gynecology

## 2019-09-09 ENCOUNTER — Encounter: Payer: Self-pay | Admitting: *Deleted

## 2019-09-10 ENCOUNTER — Telehealth: Payer: Self-pay | Admitting: Family Medicine

## 2019-09-10 NOTE — Telephone Encounter (Signed)
Attempted to contact patient w/ [place] interpreter ID# [phone] to ask patient if she is getting her prenatal care done at [facility]. No answer, interpreter left voicemail for the patient to give the office a call to verify where she is going for prenatal care.

## 2019-09-23 DIAGNOSIS — O0990 Supervision of high risk pregnancy, unspecified, unspecified trimester: Secondary | ICD-10-CM

## 2019-09-23 DIAGNOSIS — O24419 Gestational diabetes mellitus in pregnancy, unspecified control: Secondary | ICD-10-CM | POA: Insufficient documentation

## 2019-09-23 DIAGNOSIS — O09299 Supervision of pregnancy with other poor reproductive or obstetric history, unspecified trimester: Secondary | ICD-10-CM | POA: Insufficient documentation

## 2019-09-23 DIAGNOSIS — Z8632 Personal history of gestational diabetes: Secondary | ICD-10-CM

## 2019-09-23 DIAGNOSIS — J45909 Unspecified asthma, uncomplicated: Secondary | ICD-10-CM | POA: Insufficient documentation

## 2019-09-24 ENCOUNTER — Ambulatory Visit (INDEPENDENT_AMBULATORY_CARE_PROVIDER_SITE_OTHER): Payer: Medicaid Other | Admitting: Family Medicine

## 2019-09-24 ENCOUNTER — Encounter: Payer: Self-pay | Admitting: Family Medicine

## 2019-09-24 ENCOUNTER — Other Ambulatory Visit: Payer: Self-pay

## 2019-09-24 VITALS — BP 111/73 | HR 91 | Wt 171.9 lb

## 2019-09-24 DIAGNOSIS — O34219 Maternal care for unspecified type scar from previous cesarean delivery: Secondary | ICD-10-CM

## 2019-09-24 DIAGNOSIS — O0990 Supervision of high risk pregnancy, unspecified, unspecified trimester: Secondary | ICD-10-CM

## 2019-09-24 DIAGNOSIS — O24419 Gestational diabetes mellitus in pregnancy, unspecified control: Secondary | ICD-10-CM

## 2019-09-24 MED ORDER — ACCU-CHEK GUIDE W/DEVICE KIT: 1.0000 | PACK | Freq: Every day | 0 refills | Status: DC

## 2019-09-24 MED ORDER — BLOOD PRESSURE KIT DEVI: 1.0000 | Freq: Every day | 0 refills | Status: DC

## 2019-09-24 MED ORDER — ACCU-CHEK GUIDE VI STRP: ORAL_STRIP | 12 refills | Status: DC

## 2019-09-24 MED ORDER — FERROUS SULFATE 325 (65 FE) MG PO TABS: 325.0000 mg | ORAL_TABLET | Freq: Every day | ORAL | 3 refills | Status: DC

## 2019-09-24 MED ORDER — ACCU-CHEK SOFTCLIX LANCETS MISC: 12 refills | Status: DC

## 2019-09-24 NOTE — Patient Instructions (Signed)

## 2019-09-24 NOTE — Progress Notes (Signed)
   PRENATAL VISIT NOTE  Subjective:  Kristi [name] is a 29 y.o. G2P1001 at 74w5dbeing seen today for transferring prenatal care from GMailidue to GDM.  She is currently monitored for the following issues for this high-risk pregnancy and has Supervision of high risk pregnancy, antepartum; History of gestational diabetes in prior pregnancy, currently pregnant; Asthma; Gestational diabetes mellitus in pregnancy; and Previous cesarean section complicating pregnancy on their problem list.  Patient reports no complaints.  Contractions: Not present. Vag. Bleeding: None.  Movement: Present. Denies leaking of fluid.   The following portions of the patient's history were reviewed and updated as appropriate: allergies, current medications, past family history, past medical history, past social history, past surgical history and problem list.   Objective:   Vitals:   09/24/19 1002  BP: 111/73  Pulse: 91  Weight: 171 lb 14.4 oz (78 kg)    Fetal Status: Fetal Heart Rate (bpm): 157 Fundal Height: 32 cm Movement: Present     General:  Alert, oriented and cooperative. Patient is in no acute distress.  Skin: Skin is warm and dry. No rash noted.   Cardiovascular: Normal heart rate noted  Respiratory: Normal respiratory effort, no problems with respiration noted  Abdomen: Soft, gravid, appropriate for gestational age.  Pain/Pressure: Present     Pelvic: Cervical exam deferred        Extremities: Normal range of motion.  Edema: None  Mental Status: Normal mood and affect. Normal behavior. Normal judgment and thought content.   Assessment and Plan:  Pregnancy: G2P1001 at 365w5d. Gestational diabetes mellitus (GDM), antepartum, gestational diabetes method of control unspecified For education, Meter, supplies sent in  - Blood Glucose Monitoring Suppl (ACCU-CHEK GUIDE) w/Device KIT; 1 Device by Does not apply route daily. Use as directed  Dispense: 1 kit; Refill: 0 - Accu-Chek Softclix Lancets lancets;  Use as instructed  Dispense: 100 each; Refill: 12 - glucose blood (ACCU-CHEK GUIDE) test strip; Use as instructed  Dispense: 100 each; Refill: 12  2. Supervision of high risk pregnancy, antepartum  - Blood Pressure Monitoring (BLOOD PRESSURE KIT) DEVI; 1 Device by Does not apply route daily. ICD 10: Z34.00  Dispense: 1 each; Refill: 0 - ferrous sulfate 325 (65 FE) MG tablet; Take 1 tablet (325 mg total) by mouth daily with breakfast.  Dispense: 30 tablet; Refill: 3  3. Previous cesarean section complicating pregnancy Booked for RCS  Preterm labor symptoms and general obstetric precautions including but not limited to vaginal bleeding, contractions, leaking of fluid and fetal movement were reviewed in detail with the patient. Please refer to After Visit Summary for other counseling recommendations.   Return in 2 weeks (on 10/08/2019) [facility] needs diabetes and nutrition management ASAP.  Future Appointments  Date Time Provider [place]4/01/2020  1:15 PM WOReola CalkinsOSt [facility]4/14/2021  1:55 PM WiCherre BlancMD WOSt [facility] CenterOC    TaDonnamae JudeMD

## 2019-10-02 ENCOUNTER — Encounter: Payer: BC Managed Care – PPO | Attending: Family Medicine | Admitting: Registered"

## 2019-10-02 ENCOUNTER — Ambulatory Visit: Payer: Medicaid Other | Admitting: Registered"

## 2019-10-02 ENCOUNTER — Other Ambulatory Visit: Payer: Self-pay

## 2019-10-02 DIAGNOSIS — Z713 Dietary counseling and surveillance: Secondary | ICD-10-CM | POA: Diagnosis present

## 2019-10-02 DIAGNOSIS — O24419 Gestational diabetes mellitus in pregnancy, unspecified control: Secondary | ICD-10-CM | POA: Diagnosis not present

## 2019-10-02 DIAGNOSIS — Z3A00 Weeks of gestation of pregnancy not specified: Secondary | ICD-10-CM | POA: Insufficient documentation

## 2019-10-02 DIAGNOSIS — O24410 Gestational diabetes mellitus in pregnancy, diet controlled: Secondary | ICD-10-CM

## 2019-10-02 NOTE — Progress Notes (Signed)
Interpreter services provided by Marian Sorrow from [place]  Patient was seen on 10/02/19 for Gestational Diabetes self-management. EDD 11/07/19. Patient states prior history of GDM. Diet history obtained. Patient eats variety of all food groups, but not daily vegetable intake. Beverages include water, milk, diet coke.  Patient is not likely consuming excess carbohydrates.  Patient states she had diet controlled GDM with last pregnancy. Pt reports family history of diabetes. Patient reports postpartum diet returned to her [place] eating pattern with rice as the base of meals. Pt states since getting diagnosis she has returned to her GDM diet from before.  Patient states she walks 5-6 days/week at least 30 min, sometimes much more than 30 min.  The following learning objectives were met by the patient :   States the definition of Gestational Diabetes  States why dietary management is important in controlling blood glucose  Describes the effects of carbohydrates on blood glucose levels  Demonstrates ability to create a balanced meal plan  Demonstrates carbohydrate counting   States when to check blood glucose levels  Demonstrates proper blood glucose monitoring techniques  States the effect of stress and exercise on blood glucose levels  States the importance of limiting caffeine and abstaining from alcohol and smoking  Plan:  Aim for 3 Carbohydrate Choices per meal (45 grams) +/- 1 either way  Aim for 1-2 Carbohydrate Choices per snack Begin reading food labels for Total Carbohydrate of foods Continue your activity level as tolerated Continue checking Blood Glucose before breakfast and 2 hours after first bite of breakfast, lunch and dinner as directed by MD  Bring Log Book/Sheet and meter to every medical appointment  Take medication if directed by MD  Patient already has a meter and is testing pre breakfast and 2 hours after each meal. Review of Log Book shows:  Blood sugar readings  during last week were WNL except 2x FBS and 2x postprandial. Pt states the 2 meals that were out of range were due to rice intake.    Patient instructed to monitor glucose levels: FBS: 60 - 95 mg/dl 2 hour: <120 mg/dl  Patient received the following handouts:  Nutrition Diabetes and Pregnancy  Carbohydrate Counting List  Blood glucose Log Sheet  Patient will be seen for follow-up in as needed.

## 2019-10-08 ENCOUNTER — Ambulatory Visit (INDEPENDENT_AMBULATORY_CARE_PROVIDER_SITE_OTHER): Payer: Medicaid Other | Admitting: Obstetrics & Gynecology

## 2019-10-08 ENCOUNTER — Other Ambulatory Visit (HOSPITAL_COMMUNITY)
Admission: RE | Admit: 2019-10-08 | Discharge: 2019-10-08 | Disposition: A | Discharge status: Home / Self Care | Payer: Medicaid Other | Source: Ambulatory Visit | Attending: Obstetrics & Gynecology | Admitting: Obstetrics & Gynecology

## 2019-10-08 ENCOUNTER — Other Ambulatory Visit: Payer: Self-pay

## 2019-10-08 VITALS — BP 115/79 | HR 86 | Wt 174.4 lb

## 2019-10-08 DIAGNOSIS — O0990 Supervision of high risk pregnancy, unspecified, unspecified trimester: Secondary | ICD-10-CM

## 2019-10-08 DIAGNOSIS — O34219 Maternal care for unspecified type scar from previous cesarean delivery: Secondary | ICD-10-CM

## 2019-10-08 DIAGNOSIS — O24410 Gestational diabetes mellitus in pregnancy, diet controlled: Secondary | ICD-10-CM | POA: Insufficient documentation

## 2019-10-08 DIAGNOSIS — Z3A35 35 weeks gestation of pregnancy: Secondary | ICD-10-CM

## 2019-10-08 NOTE — Addendum Note (Signed)
Addended by: Ernestina Patches on: 10/08/2019 03:32 PM   Modules accepted: Orders

## 2019-10-08 NOTE — Progress Notes (Signed)
   PRENATAL VISIT NOTE  Subjective:  Kristi [name] is a 29 y.o. G2P1001 at [redacted]w[redacted]d being seen today for ongoing prenatal care.  She is currently monitored for the following issues for this high-risk pregnancy and has Supervision of high risk pregnancy, antepartum; History of gestational diabetes in prior pregnancy, currently pregnant; Asthma; Gestational diabetes mellitus in pregnancy; and Previous cesarean section complicating pregnancy on their problem list.  Patient reports no complaints.  Contractions: Not present. Vag. Bleeding: None.  Movement: Present. Denies leaking of fluid.   The following portions of the patient's history were reviewed and updated as appropriate: allergies, current medications, past family history, past medical history, past social history, past surgical history and problem list.   Objective:   Vitals:   10/08/19 1455  BP: 115/79  Pulse: 86  Weight: 79.1 kg    Fetal Status: Fetal Heart Rate (bpm): 141   Movement: Present     General:  Alert, oriented and cooperative. Patient is in no acute distress.  Skin: Skin is warm and dry. No rash noted.   Cardiovascular: Normal heart rate noted  Respiratory: Normal respiratory effort, no problems with respiration noted  Abdomen: Soft, gravid, appropriate for gestational age.  Pain/Pressure: Present     Pelvic: Cervical exam performed in the presence of a chaperone        Extremities: Normal range of motion.  Edema: None  Mental Status: Normal mood and affect. Normal behavior. Normal judgment and thought content.   Assessment and Plan:  Pregnancy: G2P1001 at [redacted]w[redacted]d 1. Diet controlled gestational diabetes mellitus (GDM), antepartum Well controlled on diet. Continue current regimen, Growth U/S ordered - US MFM OB FOLLOW UP; Future  2. Supervision of high risk pregnancy, antepartum  - [place] MFM OB FOLLOW UP; Future  3. Previous cesarean section complicating pregnancy Prev CDx1, repeat C/S scheduled for 5/7.  - [place] MFM  OB FOLLOW UP; Future  Term labor symptoms and general obstetric precautions including but not limited to vaginal bleeding, contractions, leaking of fluid and fetal movement were reviewed in detail with the patient. Please refer to After Visit Summary for other counseling recommendations.   No follow-ups on file.  Future Appointments  Date Time Provider Department Center  10/15/2019 10:35 AM Anyanwu, Jethro Bastos, MD [facility] WOC    Malachy Chamber, MD

## 2019-10-08 NOTE — Patient Instructions (Signed)

## 2019-10-09 LAB — GC/CHLAMYDIA PROBE AMP (~~LOC~~) NOT AT ARMC
Chlamydia: NEGATIVE
Comment: NEGATIVE
Comment: NORMAL
Neisseria Gonorrhea: NEGATIVE

## 2019-10-10 ENCOUNTER — Other Ambulatory Visit: Payer: Self-pay | Admitting: *Deleted

## 2019-10-10 DIAGNOSIS — O24419 Gestational diabetes mellitus in pregnancy, unspecified control: Secondary | ICD-10-CM

## 2019-10-12 LAB — CULTURE, BETA STREP (GROUP B ONLY): Strep Gp B Culture: NEGATIVE

## 2019-10-13 ENCOUNTER — Other Ambulatory Visit: Payer: Self-pay

## 2019-10-15 ENCOUNTER — Other Ambulatory Visit: Payer: Self-pay

## 2019-10-15 ENCOUNTER — Other Ambulatory Visit: Payer: Self-pay | Admitting: Student

## 2019-10-15 ENCOUNTER — Ambulatory Visit (INDEPENDENT_AMBULATORY_CARE_PROVIDER_SITE_OTHER): Payer: BC Managed Care – PPO | Admitting: Obstetrics & Gynecology

## 2019-10-15 ENCOUNTER — Inpatient Hospital Stay (HOSPITAL_COMMUNITY)
Admission: AD | Admit: 2019-10-15 | Discharge: 2019-10-15 | Disposition: A | Discharge status: Home / Self Care | Payer: BC Managed Care – PPO | Attending: Family Medicine | Admitting: Family Medicine

## 2019-10-15 ENCOUNTER — Encounter (HOSPITAL_COMMUNITY): Payer: Self-pay | Admitting: Family Medicine

## 2019-10-15 ENCOUNTER — Inpatient Hospital Stay (HOSPITAL_BASED_OUTPATIENT_CLINIC_OR_DEPARTMENT_OTHER): Payer: BC Managed Care – PPO

## 2019-10-15 VITALS — BP 114/69 | HR 90 | Wt 171.0 lb

## 2019-10-15 DIAGNOSIS — O24410 Gestational diabetes mellitus in pregnancy, diet controlled: Secondary | ICD-10-CM | POA: Diagnosis not present

## 2019-10-15 DIAGNOSIS — Z3A36 36 weeks gestation of pregnancy: Secondary | ICD-10-CM | POA: Diagnosis not present

## 2019-10-15 DIAGNOSIS — Z3689 Encounter for other specified antenatal screening: Secondary | ICD-10-CM

## 2019-10-15 DIAGNOSIS — O368330 Maternal care for abnormalities of the fetal heart rate or rhythm, third trimester, not applicable or unspecified: Secondary | ICD-10-CM | POA: Diagnosis present

## 2019-10-15 DIAGNOSIS — O34219 Maternal care for unspecified type scar from previous cesarean delivery: Secondary | ICD-10-CM | POA: Diagnosis not present

## 2019-10-15 DIAGNOSIS — O0993 Supervision of high risk pregnancy, unspecified, third trimester: Secondary | ICD-10-CM

## 2019-10-15 DIAGNOSIS — O368333 Maternal care for abnormalities of the fetal heart rate or rhythm, third trimester, fetus 3: Secondary | ICD-10-CM

## 2019-10-15 DIAGNOSIS — Z603 Acculturation difficulty: Secondary | ICD-10-CM

## 2019-10-15 DIAGNOSIS — O368390 Maternal care for abnormalities of the fetal heart rate or rhythm, unspecified trimester, not applicable or unspecified: Secondary | ICD-10-CM

## 2019-10-15 DIAGNOSIS — O0990 Supervision of high risk pregnancy, unspecified, unspecified trimester: Secondary | ICD-10-CM

## 2019-10-15 MED ORDER — GLUCOSE BLOOD VI STRP: ORAL_STRIP | 12 refills | Status: DC

## 2019-10-15 NOTE — Discharge Instructions (Signed)
Fetal Movement Counts Patient Name: ________________________________________________ Patient Due Date: ____________________ What is a fetal movement count?  A fetal movement count is the number of times that you feel your baby move during a certain amount of time. This may also be called a fetal kick count. A fetal movement count is recommended for every pregnant woman. You may be asked to start counting fetal movements as early as week 28 of your pregnancy. Pay attention to when your baby is most active. You may notice your baby's sleep and wake cycles. You may also notice things that make your baby move more. You should do a fetal movement count:  When your baby is normally most active.  At the same time each day. A good time to count movements is while you are resting, after having something to eat and drink. How do I count fetal movements? 1. Find a quiet, comfortable area. Sit, or lie down on your side. 2. Write down the date, the start time and stop time, and the number of movements that you felt between those two times. Take this information with you to your health care visits. 3. Write down your start time when you feel the first movement. 4. Count kicks, flutters, swishes, rolls, and jabs. You should feel at least 10 movements. 5. You may stop counting after you have felt 10 movements, or if you have been counting for 2 hours. Write down the stop time. 6. If you do not feel 10 movements in 2 hours, contact your health care provider for further instructions. Your health care provider may want to do additional tests to assess your baby's well-being. Contact a health care provider if:  You feel fewer than 10 movements in 2 hours.  Your baby is not moving like he or she usually does. Date: ____________ Start time: ____________ Stop time: ____________ Movements: ____________ Date: ____________ Start time: ____________ Stop time: ____________ Movements: ____________ Date: ____________  Start time: ____________ Stop time: ____________ Movements: ____________ Date: ____________ Start time: ____________ Stop time: ____________ Movements: ____________ Date: ____________ Start time: ____________ Stop time: ____________ Movements: ____________ Date: ____________ Start time: ____________ Stop time: ____________ Movements: ____________ Date: ____________ Start time: ____________ Stop time: ____________ Movements: ____________ Date: ____________ Start time: ____________ Stop time: ____________ Movements: ____________ Date: ____________ Start time: ____________ Stop time: ____________ Movements: ____________ This information is not intended to replace advice given to you by your health care provider. Make sure you discuss any questions you have with your health care provider. Document Revised: 01/30/2019 Document Reviewed: 01/30/2019 Elsevier Patient Education  2020 Elsevier Inc.  

## 2019-10-15 NOTE — Patient Instructions (Signed)
Return to office for any scheduled appointments. Call the office or go to the MAU at [facility] [place] if:  You begin to have strong, frequent contractions  Your water breaks.  Sometimes it is a big gush of fluid, sometimes it is just a trickle that keeps getting your panties wet or running down your legs  You have vaginal bleeding.  It is normal to have a small amount of spotting if your cervix was checked.   You do not feel your baby moving like normal.  If you do not, get something to eat and drink and lay down and focus on feeling your baby move.   If your baby is still not moving like normal, you should call the office or go to MAU.  Any other obstetric concerns.   

## 2019-10-15 NOTE — Progress Notes (Signed)
BPP order placed per Dr. Judeth Cornfield. Patient coming on 4/27 for MFM ob follow up -- should get BPP as well.   Judeth Horn, NP

## 2019-10-15 NOTE — Progress Notes (Signed)
   PRENATAL VISIT NOTE  Subjective:  Kristi [name] is a 29 y.o. G2P1001 at [redacted]w[redacted]d being seen today for ongoing prenatal care.  Patient is Vietnamese-speaking only, interpreter present for this encounter. She is currently monitored for the following issues for this high-risk pregnancy and has Supervision of high risk pregnancy, antepartum; History of gestational diabetes in prior pregnancy, currently pregnant; Asthma; Gestational diabetes mellitus in pregnancy; and Previous cesarean section complicating pregnancy on their problem list.  Patient reports no complaints.  Contractions: Not present. Vag. Bleeding: None.  Movement: Present. Denies leaking of fluid.   The following portions of the patient's history were reviewed and updated as appropriate: allergies, current medications, past family history, past medical history, past social history, past surgical history and problem list.   Objective:   Vitals:   10/15/19 1051  BP: 114/69  Pulse: 90  Weight: 171 lb (77.6 kg)    Fetal Status: Fetal Heart Rate (bpm): 202 Fundal Height: 37 cm Movement: Present     General:  Alert, oriented and cooperative. Patient is in no acute distress.  Skin: Skin is warm and dry. No rash noted.   Cardiovascular: Normal heart rate noted  Respiratory: Normal respiratory effort, no problems with respiration noted  Abdomen: Soft, gravid, appropriate for gestational age.  Pain/Pressure: Present     Pelvic: Cervical exam deferred        Extremities: Normal range of motion.  Edema: None  Mental Status: Normal mood and affect. Normal behavior. Normal judgment and thought content.   Assessment and Plan:  Pregnancy: G2P1001 at [redacted]w[redacted]d 1. Fetal tachycardia affecting management of mother - Fetal nonstress test to be done, will follow up results and manage accordingly.  2. Diet controlled gestational diabetes mellitus (GDM), antepartum Ran out of strips, has not checked in one week.  Strips prescribed. - glucose blood  test strip; Use as instructed QID  Dispense: 100 each; Refill: 12  3. Previous cesarean section complicating pregnancy Already scheduled for RCS on 10/31/19.  4. Supervision of high risk pregnancy, antepartum Preterm labor symptoms and general obstetric precautions including but not limited to vaginal bleeding, contractions, leaking of fluid and fetal movement were reviewed in detail with the patient. Please refer to After Visit Summary for other counseling recommendations.   Return in about 1 week (around 10/22/2019) for [facility] Visit  (Vietnamese speaking).  Future Appointments  Date Time Provider Department Center  10/21/2019  3:45 PM WH-MFC [place] 3 WH-MFCUS MFC-US  10/21/2019  4:00 PM WH-MFC NURSE WH-MFC MFC-US  10/30/2019  8:15 AM Troy Bing, MD [facility]    Jaynie Collins, MD

## 2019-10-15 NOTE — MAU Note (Signed)
Pt states this morning she went to the doctor this morning and said that the baby's heart rate was very high and told her to come here.   Reports +FM  Denies vaginal bleeding or LOF.

## 2019-10-15 NOTE — MAU Provider Note (Signed)
Chief Complaint:  Tachycardia (fetal )   First Provider Initiated Contact with Patient 10/15/19 1334     *video [place] interpreter used for this encounter*  HPI: Kristi [name] is a 29 y.o. G2P1001 at 39w5dwho presents to maternity admissions reporting need for fetal monitoring. Was in the office today for routine visit. Fetal tachycardia noted. Fetal heart rate 190s+ for 30 minutes. Sent to MAU for monitoring & BPP. Denies abdominal pain, fever, LOF, or vaginal bleeding. Good fetal movement.   Pregnancy Course: C[facility]-ELam. A1DM. RCS.   Past Medical History:  Diagnosis Date  . Gestational diabetes   . Kidney stones 2015   OB History  Gravida Para Term Preterm AB Living  2 1 1  0 0 1  SAB TAB Ectopic Multiple Live Births  0 0 0 0 1    # Outcome Date GA Lbr Len/2nd Weight Sex Delivery Anes PTL Lv  2 Current           1 Term 11/08/15 [id number] g M CS-LTranv EPI  LIV   Past Surgical History:  Procedure Laterality Date  . CESAREAN SECTION N/A 11/08/2015   Procedure: CESAREAN SECTION;  Surgeon: LuFlorian BuffMD;  [facility];  Laterality: N/A;   Family History  Problem Relation Age of Onset  . Heart disease Mother   . Diabetes Mother   . Cancer Mother        stomach  . Hypertension Father   . Diabetes Father    Social History   Tobacco Use  . Smoking status: Never Smoker  . Smokeless tobacco: Never Used  Substance Use Topics  . Alcohol use: No  . Drug use: No   No Known Allergies Medications Prior to Admission  Medication Sig Dispense Refill Last Dose  . Accu-Chek Softclix Lancets lancets Use as instructed 100 each 12 Past Week at Unknown time  . Blood Glucose Monitoring Suppl (ACCU-CHEK GUIDE) w/Device KIT 1 Device by Does not apply route daily. Use as directed 1 kit 0 Past Week at Unknown time  . Blood Pressure Monitoring (BLOOD PRESSURE KIT) DEVI 1 Device by Does not apply route daily. ICD 10: Z34.00 1 each 0 10/15/2019 at  Unknown time  . ferrous sulfate 325 (65 FE) MG tablet Take 1 tablet (325 mg total) by mouth daily with breakfast. 30 tablet 3 10/14/2019 at Unknown time  . glucose blood test strip Use as instructed QID 100 each 12 Past Week at Unknown time  . Lancet Devices (ACCU-CHEK SOFTCLIX) lancets New DX GDM O24.419 for testing 4 times daily 100 each 10 Past Week at Unknown time  . Prenatal Vit-Fe Fumarate-FA (PRENATAL MULTIVITAMIN) TABS tablet Take 1 tablet by mouth daily at 12 noon. Reported on 12/22/2015   10/14/2019 at Unknown time  . acetaminophen (TYLENOL) 500 MG tablet Take 500 mg by mouth every 6 (six) hours as needed for mild pain, moderate pain, fever or headache.   More than a month at Unknown time    I have reviewed patient's Past Medical Hx, Surgical Hx, Family Hx, Social Hx, medications and allergies.   ROS:  Review of Systems  Constitutional: Negative.   Gastrointestinal: Negative.   Genitourinary: Negative.     Physical Exam   Patient Vitals for the past 24 hrs:  BP Temp Pulse Resp SpO2  10/15/19 1452 111/66 - 85 - -  10/15/19 1317 115/66 98 F (36.7 C) 81 20 98 %    Constitutional: Well-developed, well-nourished  female in no acute distress.  Cardiovascular: normal rate & rhythm, no murmur Respiratory: normal effort, lung sounds clear throughout GI: Abd soft, non-tender, gravid appropriate for gestational age. Pos BS x 4 MS: Extremities nontender, no edema, normal ROM Neurologic: Alert and oriented x 4.   NST:  Baseline: 130 bpm, Variability: Good {> 6 bpm), Accelerations: Reactive and Decelerations: Absent   Labs: No results found for this or any previous visit (from the past 24 hour(s)).  Imaging:  No results found.  MAU Course: Orders Placed This Encounter  Procedures  . [place] MFM FETAL BPP WO NON STRESS  . Discharge patient   No orders of the defined types were placed in this encounter.   MDM: Reactive fetal tracing with normal baseline.  BPP 6/8 (off for  breathing) and normal AFI.  Reviewed with Dr. Kennon Rounds. BPP/NST 8/10. Patient ok for discharge home to keep scheduled follow up appointments.   Assessment: 1. NST (non-stress test) reactive   2. Fetal tachycardia before the onset of labor   3. [redacted] weeks gestation of pregnancy     Plan: Discharge home in stable condition.  Fetal movement form & labor precautions Keep f/u appointments in office next week    Allergies as of 10/15/2019   No Known Allergies     Medication List    TAKE these medications   Accu-Chek Guide w/Device Kit 1 Device by Does not apply route daily. Use as directed   accu-chek softclix lancets New DX GDM O24.419 for testing 4 times daily   Accu-Chek Softclix Lancets lancets Use as instructed   acetaminophen 500 MG tablet Commonly known as: TYLENOL Take 500 mg by mouth every 6 (six) hours as needed for mild pain, moderate pain, fever or headache.   Blood Pressure Kit Devi 1 Device by Does not apply route daily. ICD 10: Z34.00   ferrous sulfate 325 (65 FE) MG tablet Take 1 tablet (325 mg total) by mouth daily with breakfast.   glucose blood test strip Use as instructed QID   prenatal multivitamin Tabs tablet Take 1 tablet by mouth daily at 12 noon. Reported on 12/22/2015       Jorje Guild, NP 10/15/2019 3:08 PM

## 2019-10-15 NOTE — Progress Notes (Signed)
Prolonged fetal tachycardia still noted on NST.  Patient will be sent to MAU for further monitoring and BPP. Patient informed and agrees with this plan. MAU RN in charge and provider notified.  Jaynie Collins, MD, FACOG Obstetrician & Gynecologist, [facility] for [employer], [facility]

## 2019-10-19 LAB — CULTURE, BETA STREP (GROUP B ONLY): Strep Gp B Culture: NEGATIVE

## 2019-10-21 ENCOUNTER — Ambulatory Visit (HOSPITAL_COMMUNITY): Payer: BC Managed Care – PPO | Admitting: *Deleted

## 2019-10-21 ENCOUNTER — Ambulatory Visit (HOSPITAL_COMMUNITY)
Admission: RE | Admit: 2019-10-21 | Discharge: 2019-10-21 | Disposition: A | Discharge status: Home / Self Care | Payer: BC Managed Care – PPO | Source: Ambulatory Visit | Attending: Student | Admitting: Student

## 2019-10-21 ENCOUNTER — Other Ambulatory Visit: Payer: Self-pay

## 2019-10-21 ENCOUNTER — Encounter (HOSPITAL_COMMUNITY): Payer: Self-pay

## 2019-10-21 ENCOUNTER — Other Ambulatory Visit: Payer: Self-pay | Admitting: Obstetrics & Gynecology

## 2019-10-21 DIAGNOSIS — O0990 Supervision of high risk pregnancy, unspecified, unspecified trimester: Secondary | ICD-10-CM | POA: Diagnosis present

## 2019-10-21 DIAGNOSIS — O24410 Gestational diabetes mellitus in pregnancy, diet controlled: Secondary | ICD-10-CM

## 2019-10-21 DIAGNOSIS — Z363 Encounter for antenatal screening for malformations: Secondary | ICD-10-CM | POA: Diagnosis not present

## 2019-10-21 DIAGNOSIS — O34219 Maternal care for unspecified type scar from previous cesarean delivery: Secondary | ICD-10-CM | POA: Diagnosis present

## 2019-10-21 DIAGNOSIS — Z3A37 37 weeks gestation of pregnancy: Secondary | ICD-10-CM

## 2019-10-21 NOTE — Patient Instructions (Addendum)
Laylynn L Taulbee  10/21/2019   Your procedure is scheduled on:  10/31/2019  Arrive at 1230 at Entrance C on [address] at [facility]  and [employer]. You are invited to use the FREE valet parking or use the Visitor's parking deck.  Pick up the phone at the desk and dial [phone].  Call this number if you have problems the morning of surgery: [phone]  Remember:   Do not eat food:(After Midnight) Desps de medianoche.  Do not drink clear liquids: (6 Hours before arrival) 6 horas ante llegada.  Take these medicines the morning of surgery with A SIP OF WATER:  none   Do not wear jewelry, make-up or nail polish.  Do not wear lotions, powders, or perfumes. Do not wear deodorant.  Do not shave 48 hours prior to surgery.  Do not bring valuables to the hospital.  [facility] is not   responsible for any belongings or valuables brought to the hospital.  Contacts, dentures or bridgework may not be worn into surgery.  Leave suitcase in the car. After surgery it may be brought to your room.  For patients admitted to the hospital, checkout time is 11:00 AM the day of              discharge.      Please read over the following fact sheets that you were given:     Preparing for Surgery

## 2019-10-22 ENCOUNTER — Telehealth (HOSPITAL_COMMUNITY): Payer: Self-pay | Admitting: *Deleted

## 2019-10-22 ENCOUNTER — Ambulatory Visit (INDEPENDENT_AMBULATORY_CARE_PROVIDER_SITE_OTHER): Payer: Medicaid Other | Admitting: Family Medicine

## 2019-10-22 VITALS — BP 107/69 | HR 80 | Wt 172.7 lb

## 2019-10-22 DIAGNOSIS — O0990 Supervision of high risk pregnancy, unspecified, unspecified trimester: Secondary | ICD-10-CM

## 2019-10-22 DIAGNOSIS — Z3A37 37 weeks gestation of pregnancy: Secondary | ICD-10-CM

## 2019-10-22 DIAGNOSIS — O34219 Maternal care for unspecified type scar from previous cesarean delivery: Secondary | ICD-10-CM

## 2019-10-22 DIAGNOSIS — O24410 Gestational diabetes mellitus in pregnancy, diet controlled: Secondary | ICD-10-CM

## 2019-10-22 LAB — POCT URINALYSIS DIP (DEVICE)
Bilirubin Urine: NEGATIVE
Glucose, UA: NEGATIVE mg/dL
Hgb urine dipstick: NEGATIVE
Ketones, ur: NEGATIVE mg/dL
Nitrite: NEGATIVE
Protein, ur: NEGATIVE mg/dL
Specific Gravity, Urine: 1.02 (ref 1.005–1.030)
Urobilinogen, UA: 0.2 mg/dL (ref 0.0–1.0)
pH: 7 (ref 5.0–8.0)

## 2019-10-22 LAB — GLUCOSE, CAPILLARY: Glucose-Capillary: 92 mg/dL (ref 70–99)

## 2019-10-22 NOTE — Pre-Procedure Instructions (Signed)
Interpreter number [phone]

## 2019-10-22 NOTE — Patient Instructions (Signed)

## 2019-10-22 NOTE — Progress Notes (Signed)
   PRENATAL VISIT NOTE  Subjective:  Kristi [name] is a 29 y.o. G2P1001 at [redacted]w[redacted]d being seen today for ongoing prenatal care.  She is currently monitored for the following issues for this high-risk pregnancy and has Supervision of high risk pregnancy, antepartum; History of gestational diabetes in prior pregnancy, currently pregnant; Asthma; Gestational diabetes mellitus in pregnancy; and Previous cesarean section complicating pregnancy on their problem list.  Patient reports no complaints.  Contractions: Irregular. Vag. Bleeding: None.  Movement: Present. Denies leaking of fluid.   The following portions of the patient's history were reviewed and updated as appropriate: allergies, current medications, past family history, past medical history, past social history, past surgical history and problem list.   Objective:   Vitals:   10/22/19 1059  BP: 107/69  Pulse: 80  Weight: 172 lb 11.2 oz (78.3 kg)    Fetal Status: Fetal Heart Rate (bpm): 132 Fundal Height: 34 cm Movement: Present  Presentation: Vertex  General:  Alert, oriented and cooperative. Patient is in no acute distress.  Skin: Skin is warm and dry. No rash noted.   Cardiovascular: Normal heart rate noted  Respiratory: Normal respiratory effort, no problems with respiration noted  Abdomen: Soft, gravid, appropriate for gestational age.  Pain/Pressure: Present     Pelvic: Cervical exam deferred        Extremities: Normal range of motion.  Edema: None  Mental Status: Normal mood and affect. Normal behavior. Normal judgment and thought content.   Assessment and Plan:  Pregnancy: G2P1001 at [redacted]w[redacted]d 1. Diet controlled gestational diabetes mellitus (GDM) in third trimester No lancets, waiting on it coming from pharmacy. Fasting today in office is 92. Ok, continue diet. U/s for growth ok yesterday.  2. Supervision of high risk pregnancy, antepartum GBS negative  3. Previous cesarean section complicating pregnancy For RCS at 39 wks   Term labor symptoms and general obstetric precautions including but not limited to vaginal bleeding, contractions, leaking of fluid and fetal movement were reviewed in detail with the patient. Please refer to After Visit Summary for other counseling recommendations.   Return in 1 week (on 10/29/2019) for in person due to language, HRC.  Future Appointments  Date Time Provider Department Center  10/29/2019  8:40 AM MC-MAU 1 MC-INDC None  10/30/2019  8:15 AM [place] Bing, MD [facility]    Reva Bores, MD

## 2019-10-22 NOTE — Telephone Encounter (Signed)
Preadmission screen  

## 2019-10-23 ENCOUNTER — Encounter (HOSPITAL_COMMUNITY): Payer: Self-pay

## 2019-10-23 NOTE — Pre-Procedure Instructions (Signed)
Interpreter number [phone]

## 2019-10-27 ENCOUNTER — Encounter: Payer: Medicaid Other | Admitting: Obstetrics & Gynecology

## 2019-10-29 ENCOUNTER — Other Ambulatory Visit (HOSPITAL_COMMUNITY)
Admission: RE | Admit: 2019-10-29 | Discharge: 2019-10-29 | Disposition: A | Discharge status: Home / Self Care | Payer: BC Managed Care – PPO | Source: Ambulatory Visit | Attending: Family Medicine | Admitting: Family Medicine

## 2019-10-29 ENCOUNTER — Other Ambulatory Visit: Payer: Self-pay

## 2019-10-29 DIAGNOSIS — Z20822 Contact with and (suspected) exposure to covid-19: Secondary | ICD-10-CM | POA: Insufficient documentation

## 2019-10-29 DIAGNOSIS — Z01812 Encounter for preprocedural laboratory examination: Secondary | ICD-10-CM | POA: Insufficient documentation

## 2019-10-29 LAB — CBC
HCT: 41.5 % (ref 36.0–46.0)
Hemoglobin: 12.8 g/dL (ref 12.0–15.0)
MCH: 21.6 pg — ABNORMAL LOW (ref 26.0–34.0)
MCHC: 30.8 g/dL (ref 30.0–36.0)
MCV: 70.1 fL — ABNORMAL LOW (ref 80.0–100.0)
Platelets: 205 10*3/uL (ref 150–400)
RBC: 5.92 MIL/uL — ABNORMAL HIGH (ref 3.87–5.11)
RDW: 16.4 % — ABNORMAL HIGH (ref 11.5–15.5)
WBC: 10.3 10*3/uL (ref 4.0–10.5)
nRBC: 0 % (ref 0.0–0.2)

## 2019-10-29 LAB — TYPE AND SCREEN
ABO/RH(D): O POS
Antibody Screen: NEGATIVE

## 2019-10-29 LAB — RPR: RPR Ser Ql: NONREACTIVE

## 2019-10-29 LAB — SARS CORONAVIRUS 2 (TAT 6-24 HRS): SARS Coronavirus 2: NEGATIVE

## 2019-10-29 LAB — ABO/RH: ABO/RH(D): O POS

## 2019-10-29 NOTE — MAU Note (Signed)
Pt here for covid test and labs for scheduled c/s. She is asymptomatic.

## 2019-10-30 ENCOUNTER — Ambulatory Visit (INDEPENDENT_AMBULATORY_CARE_PROVIDER_SITE_OTHER): Payer: BC Managed Care – PPO | Admitting: Obstetrics and Gynecology

## 2019-10-30 VITALS — BP 112/75 | HR 87 | Wt 174.8 lb

## 2019-10-30 DIAGNOSIS — O0993 Supervision of high risk pregnancy, unspecified, third trimester: Secondary | ICD-10-CM | POA: Diagnosis not present

## 2019-10-30 DIAGNOSIS — O24419 Gestational diabetes mellitus in pregnancy, unspecified control: Secondary | ICD-10-CM | POA: Diagnosis not present

## 2019-10-30 DIAGNOSIS — Z789 Other specified health status: Secondary | ICD-10-CM

## 2019-10-30 DIAGNOSIS — O0990 Supervision of high risk pregnancy, unspecified, unspecified trimester: Secondary | ICD-10-CM

## 2019-10-30 DIAGNOSIS — Z603 Acculturation difficulty: Secondary | ICD-10-CM | POA: Diagnosis not present

## 2019-10-30 DIAGNOSIS — O34219 Maternal care for unspecified type scar from previous cesarean delivery: Secondary | ICD-10-CM | POA: Diagnosis not present

## 2019-10-30 DIAGNOSIS — Z3A38 38 weeks gestation of pregnancy: Secondary | ICD-10-CM | POA: Diagnosis not present

## 2019-10-30 LAB — GLUCOSE, CAPILLARY: Glucose-Capillary: 95 mg/dL (ref 70–99)

## 2019-10-30 NOTE — Progress Notes (Signed)
Prenatal Visit Note Date: 10/30/2019 Clinic: [facility]  Subjective:  Kristi [name] is a 29 y.o. G2P1001 at [redacted]w[redacted]d being seen today for ongoing prenatal care.  She is currently monitored for the following issues for this high-risk pregnancy and has Supervision of high risk pregnancy, antepartum; History of gestational diabetes in prior pregnancy, currently pregnant; Asthma; Gestational diabetes mellitus in pregnancy; Previous cesarean section complicating pregnancy; and Language barrier on their problem list.  Patient reports no complaints.   Contractions: Irregular. Vag. Bleeding: None.  Movement: Present. Denies leaking of fluid.   The following portions of the patient's history were reviewed and updated as appropriate: allergies, current medications, past family history, past medical history, past social history, past surgical history and problem list. Problem list updated.  Objective:   Vitals:   10/30/19 0828  BP: 112/75  Pulse: 87  Weight: 174 lb 12.8 oz (79.3 kg)    Fetal Status: Fetal Heart Rate (bpm): 137   Movement: Present     General:  Alert, oriented and cooperative. Patient is in no acute distress.  Skin: Skin is warm and dry. No rash noted.   Cardiovascular: Normal heart rate noted  Respiratory: Normal respiratory effort, no problems with respiration noted  Abdomen: Soft, gravid, appropriate for gestational age. Pain/Pressure: Present     Pelvic:  Cervical exam deferred        Extremities: Normal range of motion.  Edema: Trace  Mental Status: Normal mood and affect. Normal behavior. Normal judgment and thought content.   Urinalysis:      Assessment and Plan:  Pregnancy: G2P1001 at [redacted]w[redacted]d  1. Gestational diabetes mellitus (GDM) in third trimester, gestational diabetes method of control unspecified Has not been checking her CBGs for the past two weeks because she said the strips were sent in and she needed lacnets. CBG this morning was 95.  Add on NST reactive: 135 baseline, +accels, no decel, mod variability, toco with occasional ucs x 19m. Will give her enough supplies to last for tomorrow.   2. Supervision of high risk pregnancy, antepartum   3. Previous cesarean section complicating pregnancy Rpt tomorrow  4. Language barrier Interpreter used.   Term labor symptoms and general obstetric precautions including but not limited to vaginal bleeding, contractions, leaking of fluid and fetal movement were reviewed in detail with the patient. Please refer to After Visit Summary for other counseling recommendations.  Return for 10-14d rn incision check; 6wk pp visit with 2h GTT.   [place] Bing, MD

## 2019-10-31 ENCOUNTER — Encounter (HOSPITAL_COMMUNITY): Payer: Self-pay | Admitting: Obstetrics & Gynecology

## 2019-10-31 ENCOUNTER — Inpatient Hospital Stay (HOSPITAL_COMMUNITY)
Admission: RE | Admit: 2019-10-31 | Discharge: 2019-11-02 | DRG: 788 | Disposition: A | Discharge status: Home / Self Care | Payer: BC Managed Care – PPO | Attending: Obstetrics & Gynecology | Admitting: Obstetrics & Gynecology

## 2019-10-31 ENCOUNTER — Encounter (HOSPITAL_COMMUNITY)
Admission: RE | Disposition: A | Discharge status: Home / Self Care | Payer: Self-pay | Source: Home / Self Care | Attending: Obstetrics & Gynecology

## 2019-10-31 ENCOUNTER — Other Ambulatory Visit: Payer: Self-pay

## 2019-10-31 ENCOUNTER — Inpatient Hospital Stay (HOSPITAL_COMMUNITY): Payer: BC Managed Care – PPO | Admitting: Anesthesiology

## 2019-10-31 DIAGNOSIS — O0990 Supervision of high risk pregnancy, unspecified, unspecified trimester: Secondary | ICD-10-CM

## 2019-10-31 DIAGNOSIS — O24419 Gestational diabetes mellitus in pregnancy, unspecified control: Secondary | ICD-10-CM | POA: Diagnosis present

## 2019-10-31 DIAGNOSIS — O9952 Diseases of the respiratory system complicating childbirth: Secondary | ICD-10-CM | POA: Diagnosis present

## 2019-10-31 DIAGNOSIS — O24420 Gestational diabetes mellitus in childbirth, diet controlled: Secondary | ICD-10-CM | POA: Diagnosis present

## 2019-10-31 DIAGNOSIS — O34211 Maternal care for low transverse scar from previous cesarean delivery: Principal | ICD-10-CM | POA: Diagnosis present

## 2019-10-31 DIAGNOSIS — Z3A39 39 weeks gestation of pregnancy: Secondary | ICD-10-CM | POA: Diagnosis not present

## 2019-10-31 DIAGNOSIS — Z789 Other specified health status: Secondary | ICD-10-CM | POA: Diagnosis present

## 2019-10-31 DIAGNOSIS — Z87442 Personal history of urinary calculi: Secondary | ICD-10-CM | POA: Diagnosis not present

## 2019-10-31 DIAGNOSIS — O34219 Maternal care for unspecified type scar from previous cesarean delivery: Secondary | ICD-10-CM | POA: Diagnosis present

## 2019-10-31 DIAGNOSIS — J45909 Unspecified asthma, uncomplicated: Secondary | ICD-10-CM | POA: Diagnosis present

## 2019-10-31 LAB — GLUCOSE, CAPILLARY
Glucose-Capillary: 85 mg/dL (ref 70–99)
Glucose-Capillary: 86 mg/dL (ref 70–99)

## 2019-10-31 SURGERY — Surgical Case
Anesthesia: Spinal

## 2019-10-31 MED ORDER — FENTANYL CITRATE (PF) 100 MCG/2ML IJ SOLN: 25.0000 ug | INTRAMUSCULAR | Status: DC | PRN

## 2019-10-31 MED ORDER — KETOROLAC TROMETHAMINE 30 MG/ML IJ SOLN
30.0000 mg | Freq: Once | INTRAMUSCULAR | Status: AC
  Administered 2019-10-31: 30 mg via INTRAVENOUS

## 2019-10-31 MED ORDER — OXYTOCIN 40 UNITS IN NORMAL SALINE INFUSION - SIMPLE MED: 2.5000 [IU]/h | INTRAVENOUS | Status: AC

## 2019-10-31 MED ORDER — NALBUPHINE HCL 10 MG/ML IJ SOLN: 5.0000 mg | INTRAMUSCULAR | Status: DC | PRN

## 2019-10-31 MED ORDER — DEXAMETHASONE SODIUM PHOSPHATE 4 MG/ML IJ SOLN
INTRAMUSCULAR | Status: DC | PRN
  Administered 2019-10-31: 4 mg via INTRAVENOUS

## 2019-10-31 MED ORDER — ACETAMINOPHEN 500 MG PO TABS
ORAL_TABLET | ORAL | Status: AC
  Filled 2019-10-31: qty 2

## 2019-10-31 MED ORDER — PROMETHAZINE HCL 25 MG/ML IJ SOLN: 6.2500 mg | INTRAMUSCULAR | Status: DC | PRN

## 2019-10-31 MED ORDER — BUPIVACAINE HCL (PF) 0.5 % IJ SOLN
INTRAMUSCULAR | Status: DC | PRN
  Administered 2019-10-31: 30 mL

## 2019-10-31 MED ORDER — FENTANYL CITRATE (PF) 100 MCG/2ML IJ SOLN
INTRAMUSCULAR | Status: DC | PRN
  Administered 2019-10-31: 15 ug via INTRATHECAL

## 2019-10-31 MED ORDER — CEFAZOLIN SODIUM-DEXTROSE 2-4 GM/100ML-% IV SOLN
2.0000 g | INTRAVENOUS | Status: AC
  Administered 2019-10-31: 2 g via INTRAVENOUS

## 2019-10-31 MED ORDER — SENNOSIDES-DOCUSATE SODIUM 8.6-50 MG PO TABS
2.0000 | ORAL_TABLET | ORAL | Status: DC
  Administered 2019-11-01 (×2): 2 via ORAL
  Filled 2019-10-31 (×2): qty 2

## 2019-10-31 MED ORDER — KETOROLAC TROMETHAMINE 30 MG/ML IJ SOLN: 30.0000 mg | Freq: Four times a day (QID) | INTRAMUSCULAR | Status: AC | PRN

## 2019-10-31 MED ORDER — BUPIVACAINE HCL (PF) 0.5 % IJ SOLN
INTRAMUSCULAR | Status: AC
  Filled 2019-10-31: qty 30

## 2019-10-31 MED ORDER — ONDANSETRON HCL 4 MG/2ML IJ SOLN
INTRAMUSCULAR | Status: AC
  Filled 2019-10-31: qty 2

## 2019-10-31 MED ORDER — PHENYLEPHRINE HCL-NACL 20-0.9 MG/250ML-% IV SOLN
INTRAVENOUS | Status: DC | PRN
  Administered 2019-10-31: 60 ug/min via INTRAVENOUS

## 2019-10-31 MED ORDER — SIMETHICONE 80 MG PO CHEW
80.0000 mg | CHEWABLE_TABLET | Freq: Three times a day (TID) | ORAL | Status: DC
  Administered 2019-11-01 – 2019-11-02 (×5): 80 mg via ORAL
  Filled 2019-10-31 (×5): qty 1

## 2019-10-31 MED ORDER — KETOROLAC TROMETHAMINE 30 MG/ML IJ SOLN
INTRAMUSCULAR | Status: AC
  Filled 2019-10-31: qty 1

## 2019-10-31 MED ORDER — SODIUM CHLORIDE 0.9% FLUSH: 3.0000 mL | INTRAVENOUS | Status: DC | PRN

## 2019-10-31 MED ORDER — NALOXONE HCL 0.4 MG/ML IJ SOLN: 0.4000 mg | INTRAMUSCULAR | Status: DC | PRN

## 2019-10-31 MED ORDER — NALBUPHINE HCL 10 MG/ML IJ SOLN: 5.0000 mg | Freq: Once | INTRAMUSCULAR | Status: DC | PRN

## 2019-10-31 MED ORDER — ZOLPIDEM TARTRATE 5 MG PO TABS: 5.0000 mg | ORAL_TABLET | Freq: Every evening | ORAL | Status: DC | PRN

## 2019-10-31 MED ORDER — SIMETHICONE 80 MG PO CHEW
80.0000 mg | CHEWABLE_TABLET | ORAL | Status: DC
  Administered 2019-11-01 (×2): 80 mg via ORAL
  Filled 2019-10-31 (×2): qty 1

## 2019-10-31 MED ORDER — DEXAMETHASONE SODIUM PHOSPHATE 4 MG/ML IJ SOLN
INTRAMUSCULAR | Status: AC
  Filled 2019-10-31: qty 1

## 2019-10-31 MED ORDER — BUPIVACAINE IN DEXTROSE 0.75-8.25 % IT SOLN
INTRATHECAL | Status: DC | PRN
  Administered 2019-10-31: 1.4 mL via INTRATHECAL

## 2019-10-31 MED ORDER — KETOROLAC TROMETHAMINE 30 MG/ML IJ SOLN
30.0000 mg | Freq: Four times a day (QID) | INTRAMUSCULAR | Status: AC
  Administered 2019-11-01 (×3): 30 mg via INTRAVENOUS
  Filled 2019-10-31 (×3): qty 1

## 2019-10-31 MED ORDER — ACETAMINOPHEN 500 MG PO TABS: 1000.0000 mg | ORAL_TABLET | Freq: Once | ORAL | Status: DC

## 2019-10-31 MED ORDER — MORPHINE SULFATE (PF) 0.5 MG/ML IJ SOLN
INTRAMUSCULAR | Status: AC
  Filled 2019-10-31: qty 10

## 2019-10-31 MED ORDER — ONDANSETRON HCL 4 MG/2ML IJ SOLN
4.0000 mg | Freq: Three times a day (TID) | INTRAMUSCULAR | Status: DC | PRN
  Filled 2019-10-31: qty 2

## 2019-10-31 MED ORDER — OXYTOCIN 40 UNITS IN NORMAL SALINE INFUSION - SIMPLE MED
INTRAVENOUS | Status: DC | PRN
  Administered 2019-10-31: 1000 mL/h via INTRAVENOUS

## 2019-10-31 MED ORDER — LACTATED RINGERS IV SOLN: INTRAVENOUS | Status: DC

## 2019-10-31 MED ORDER — ACETAMINOPHEN 500 MG PO TABS
1000.0000 mg | ORAL_TABLET | ORAL | Status: AC
  Administered 2019-10-31: 14:00:00 1000 mg via ORAL

## 2019-10-31 MED ORDER — SOD CITRATE-CITRIC ACID 500-334 MG/5ML PO SOLN: 30.0000 mL | ORAL | Status: DC

## 2019-10-31 MED ORDER — TRANEXAMIC ACID-NACL 1000-0.7 MG/100ML-% IV SOLN
INTRAVENOUS | Status: AC
  Filled 2019-10-31: qty 100

## 2019-10-31 MED ORDER — ACETAMINOPHEN 500 MG PO TABS
1000.0000 mg | ORAL_TABLET | Freq: Four times a day (QID) | ORAL | Status: DC
  Administered 2019-11-01 – 2019-11-02 (×6): 1000 mg via ORAL
  Filled 2019-10-31 (×7): qty 2

## 2019-10-31 MED ORDER — PHENYLEPHRINE HCL-NACL 20-0.9 MG/250ML-% IV SOLN
INTRAVENOUS | Status: AC
  Filled 2019-10-31: qty 250

## 2019-10-31 MED ORDER — DIPHENHYDRAMINE HCL 25 MG PO CAPS: 25.0000 mg | ORAL_CAPSULE | Freq: Four times a day (QID) | ORAL | Status: DC | PRN

## 2019-10-31 MED ORDER — COCONUT OIL OIL: 1.0000 "application " | TOPICAL_OIL | Status: DC | PRN

## 2019-10-31 MED ORDER — PRENATAL MULTIVITAMIN CH
1.0000 | ORAL_TABLET | Freq: Every day | ORAL | Status: DC
  Administered 2019-11-01 – 2019-11-02 (×2): 1 via ORAL
  Filled 2019-10-31 (×2): qty 1

## 2019-10-31 MED ORDER — ACETAMINOPHEN 500 MG PO TABS: 1000.0000 mg | ORAL_TABLET | Freq: Four times a day (QID) | ORAL | Status: DC

## 2019-10-31 MED ORDER — NALOXONE HCL 4 MG/10ML IJ SOLN
1.0000 ug/kg/h | INTRAVENOUS | Status: DC | PRN
  Filled 2019-10-31: qty 5

## 2019-10-31 MED ORDER — DIBUCAINE (PERIANAL) 1 % EX OINT: 1.0000 "application " | TOPICAL_OINTMENT | CUTANEOUS | Status: DC | PRN

## 2019-10-31 MED ORDER — FENTANYL CITRATE (PF) 100 MCG/2ML IJ SOLN
INTRAMUSCULAR | Status: AC
  Filled 2019-10-31: qty 2

## 2019-10-31 MED ORDER — OXYTOCIN 40 UNITS IN NORMAL SALINE INFUSION - SIMPLE MED
INTRAVENOUS | Status: AC
  Filled 2019-10-31: qty 1000

## 2019-10-31 MED ORDER — MORPHINE SULFATE (PF) 0.5 MG/ML IJ SOLN
INTRAMUSCULAR | Status: DC | PRN
  Administered 2019-10-31: .15 mg via INTRATHECAL

## 2019-10-31 MED ORDER — SIMETHICONE 80 MG PO CHEW: 80.0000 mg | CHEWABLE_TABLET | ORAL | Status: DC | PRN

## 2019-10-31 MED ORDER — DIPHENHYDRAMINE HCL 50 MG/ML IJ SOLN: 12.5000 mg | INTRAMUSCULAR | Status: DC | PRN

## 2019-10-31 MED ORDER — MENTHOL 3 MG MT LOZG: 1.0000 | LOZENGE | OROMUCOSAL | Status: DC | PRN

## 2019-10-31 MED ORDER — HYDROMORPHONE HCL 1 MG/ML IJ SOLN: 0.2000 mg | INTRAMUSCULAR | Status: DC | PRN

## 2019-10-31 MED ORDER — ENOXAPARIN SODIUM 40 MG/0.4ML ~~LOC~~ SOLN
40.0000 mg | SUBCUTANEOUS | Status: DC
  Administered 2019-11-01 – 2019-11-02 (×2): 40 mg via SUBCUTANEOUS
  Filled 2019-10-31 (×2): qty 0.4

## 2019-10-31 MED ORDER — ONDANSETRON HCL 4 MG/2ML IJ SOLN
INTRAMUSCULAR | Status: DC | PRN
  Administered 2019-10-31: 4 mg via INTRAVENOUS

## 2019-10-31 MED ORDER — KETOROLAC TROMETHAMINE 30 MG/ML IJ SOLN: 30.0000 mg | Freq: Once | INTRAMUSCULAR | Status: DC

## 2019-10-31 MED ORDER — LACTATED RINGERS IV SOLN
INTRAVENOUS | Status: DC | PRN
Start: 2019-10-31 — End: 2019-10-31

## 2019-10-31 MED ORDER — WITCH HAZEL-GLYCERIN EX PADS: 1.0000 "application " | MEDICATED_PAD | CUTANEOUS | Status: DC | PRN

## 2019-10-31 MED ORDER — DIPHENHYDRAMINE HCL 25 MG PO CAPS: 25.0000 mg | ORAL_CAPSULE | ORAL | Status: DC | PRN

## 2019-10-31 MED ORDER — TRANEXAMIC ACID-NACL 1000-0.7 MG/100ML-% IV SOLN
INTRAVENOUS | Status: DC | PRN
  Administered 2019-10-31: 1000 mg via INTRAVENOUS

## 2019-10-31 MED ORDER — IBUPROFEN 800 MG PO TABS
800.0000 mg | ORAL_TABLET | Freq: Four times a day (QID) | ORAL | Status: DC
  Administered 2019-11-01 – 2019-11-02 (×4): 800 mg via ORAL
  Filled 2019-10-31 (×4): qty 1

## 2019-10-31 MED ORDER — GABAPENTIN 300 MG PO CAPS
300.0000 mg | ORAL_CAPSULE | ORAL | Status: AC
  Administered 2019-10-31: 300 mg via ORAL

## 2019-10-31 MED ORDER — TETANUS-DIPHTH-ACELL PERTUSSIS 5-2.5-18.5 LF-MCG/0.5 IM SUSP: 0.5000 mL | Freq: Once | INTRAMUSCULAR | Status: DC

## 2019-10-31 MED ORDER — CEFAZOLIN SODIUM-DEXTROSE 2-4 GM/100ML-% IV SOLN
INTRAVENOUS | Status: AC
  Filled 2019-10-31: qty 100

## 2019-10-31 MED ORDER — GABAPENTIN 300 MG PO CAPS
ORAL_CAPSULE | ORAL | Status: AC
  Filled 2019-10-31: qty 1

## 2019-10-31 MED ORDER — OXYCODONE HCL 5 MG PO TABS: 5.0000 mg | ORAL_TABLET | ORAL | Status: DC | PRN

## 2019-10-31 MED ORDER — ACETAMINOPHEN 160 MG/5ML PO SOLN: 1000.0000 mg | Freq: Once | ORAL | Status: DC

## 2019-10-31 SURGICAL SUPPLY — 40 items
BARRIER ADHS 3X4 INTERCEED (GAUZE/BANDAGES/DRESSINGS) IMPLANT
BENZOIN TINCTURE PRP APPL 2/3 (GAUZE/BANDAGES/DRESSINGS) ×2 IMPLANT
CHLORAPREP W/TINT 26ML (MISCELLANEOUS) ×3 IMPLANT
CLAMP CORD UMBIL (MISCELLANEOUS) IMPLANT
CLOSURE WOUND 1/2 X4 (GAUZE/BANDAGES/DRESSINGS) ×1
CLOTH BEACON ORANGE TIMEOUT ST (SAFETY) ×3 IMPLANT
DRSG OPSITE POSTOP 4X10 (GAUZE/BANDAGES/DRESSINGS) ×3 IMPLANT
ELECT REM PT RETURN 9FT ADLT (ELECTROSURGICAL) ×3
ELECTRODE REM PT RTRN 9FT ADLT (ELECTROSURGICAL) ×1 IMPLANT
EXTRACTOR VACUUM KIWI (MISCELLANEOUS) IMPLANT
GAUZE SPONGE 4X4 12PLY STRL LF (GAUZE/BANDAGES/DRESSINGS) ×4 IMPLANT
GLOVE BIO SURGEON STRL SZ 6.5 (GLOVE) ×2 IMPLANT
GLOVE BIO SURGEONS STRL SZ 6.5 (GLOVE) ×1
GLOVE BIOGEL PI IND STRL 7.0 (GLOVE) ×2 IMPLANT
GLOVE BIOGEL PI INDICATOR 7.0 (GLOVE) ×4
GOWN STRL REUS W/TWL LRG LVL3 (GOWN DISPOSABLE) ×6 IMPLANT
HEMOSTAT ARISTA ABSORB 3G PWDR (HEMOSTASIS) ×2 IMPLANT
KIT ABG SYR 3ML LUER SLIP (SYRINGE) IMPLANT
NDL HYPO 25X5/8 SAFETYGLIDE (NEEDLE) IMPLANT
NEEDLE HYPO 22GX1.5 SAFETY (NEEDLE) IMPLANT
NEEDLE HYPO 25X5/8 SAFETYGLIDE (NEEDLE) IMPLANT
NS IRRIG 1000ML POUR BTL (IV SOLUTION) ×3 IMPLANT
PACK C SECTION WH (CUSTOM PROCEDURE TRAY) ×3 IMPLANT
PAD ABD 8X10 STRL (GAUZE/BANDAGES/DRESSINGS) ×2 IMPLANT
PAD OB MATERNITY 4.3X12.25 (PERSONAL CARE ITEMS) ×3 IMPLANT
PENCIL SMOKE EVAC W/HOLSTER (ELECTROSURGICAL) ×3 IMPLANT
RETRACTOR WND ALEXIS 25 LRG (MISCELLANEOUS) IMPLANT
RTRCTR WOUND ALEXIS 25CM LRG (MISCELLANEOUS)
STRIP CLOSURE SKIN 1/2X4 (GAUZE/BANDAGES/DRESSINGS) ×1 IMPLANT
SUT MNCRL 0 VIOLET CTX 36 (SUTURE) IMPLANT
SUT MONOCRYL 0 CTX 36 (SUTURE) ×2
SUT VIC AB 0 CT1 36 (SUTURE) ×18 IMPLANT
SUT VIC AB 2-0 CT1 27 (SUTURE) ×2
SUT VIC AB 2-0 CT1 TAPERPNT 27 (SUTURE) ×1 IMPLANT
SUT VIC AB 4-0 PS2 27 (SUTURE) ×3 IMPLANT
SYR CONTROL 10ML LL (SYRINGE) IMPLANT
TAPE CLOTH SURG 6X10 WHT LF (GAUZE/BANDAGES/DRESSINGS) ×2 IMPLANT
TOWEL OR 17X24 6PK STRL BLUE (TOWEL DISPOSABLE) ×3 IMPLANT
TRAY FOLEY W/BAG SLVR 14FR LF (SET/KITS/TRAYS/PACK) IMPLANT
WATER STERILE IRR 1000ML POUR (IV SOLUTION) ×3 IMPLANT

## 2019-10-31 NOTE — Anesthesia Preprocedure Evaluation (Addendum)
Anesthesia Evaluation  Patient identified by MRN, date of birth, ID band Patient awake    Reviewed: Allergy & Precautions, NPO status , Patient's Chart, lab work & pertinent test results  History of Anesthesia Complications Negative for: history of anesthetic complications  Airway Mallampati: I  TM Distance: >3 FB Neck ROM: Full    Dental  (+) Missing,    Pulmonary asthma ,    Pulmonary exam normal        Cardiovascular negative cardio ROS Normal cardiovascular exam     Neuro/Psych negative neurological ROS  negative psych ROS   GI/Hepatic negative GI ROS, Neg liver ROS,   Endo/Other  diabetes, Gestational  Renal/GU negative Renal ROS  negative genitourinary   Musculoskeletal negative musculoskeletal ROS (+)   Abdominal   Peds  Hematology negative hematology ROS (+)   Anesthesia Other Findings Day of surgery medications reviewed with patient.  Reproductive/Obstetrics (+) Pregnancy (Hx of C/S x1)                            Anesthesia Physical Anesthesia Plan  ASA: III  Anesthesia Plan: Spinal   Post-op Pain Management:    Induction:   PONV Risk Score and Plan: 4 or greater and Treatment may vary due to age or medical condition, Ondansetron and Dexamethasone  Airway Management Planned: Natural Airway  Additional Equipment: None  Intra-op Plan:   Post-operative Plan:   Informed Consent: I have reviewed the patients History and Physical, chart, labs and discussed the procedure including the risks, benefits and alternatives for the proposed anesthesia with the patient or authorized representative who has indicated his/her understanding and acceptance.       Plan Discussed with: CRNA  Anesthesia Plan Comments:        Anesthesia Quick Evaluation

## 2019-10-31 NOTE — H&P (Signed)
Kristi [name] is a 29 y.o. female presenting for repeat cesarean section at 39 weeks. G2P1001 . OB History    Gravida  2   Para  1   Term  1   Preterm  0   AB  0   Living  1     SAB  0   TAB  0   Ectopic  0   Multiple  0   Live Births  1          Past Medical History:  Diagnosis Date  . Gestational diabetes   . Kidney stones 2015   Past Surgical History:  Procedure Laterality Date  . CESAREAN SECTION N/A 11/08/2015   Procedure: CESAREAN SECTION;  Surgeon: Lazaro Arms, MD;  Location: [facility] BIRTHING SUITES;  Service: Obstetrics;  Laterality: N/A;   Family History: family history includes Cancer in her mother; Diabetes in her father and mother; Heart disease in her mother; Hypertension in her father and mother. Social History:  reports that she has never smoked. She has never used smokeless tobacco. She reports that she does not drink alcohol or use drugs.     Maternal Diabetes: Yes:  Diabetes Type:  Diet controlled Genetic Screening: Normal Maternal Ultrasounds/Referrals: Normal Fetal Ultrasounds or other Referrals:  None Maternal Substance Abuse:  No Significant Maternal Medications:  None Significant Maternal Lab Results:  Group B Strep negative Other Comments:  None  Review of Systems  Constitutional: Negative.   HENT: Negative.   Respiratory: Negative.   Gastrointestinal: Negative.   Genitourinary: Negative.    History   Last menstrual period 01/31/2019, unknown if currently breastfeeding. Maternal Exam:  Abdomen: Patient reports no abdominal tenderness. Surgical scars: low transverse.   Introitus: not evaluated.   Cervix: not evaluated.    Physical Exam  Vitals reviewed. Constitutional: She is oriented to person, place, and time. She appears well-developed.  Cardiovascular: Normal rate.  Respiratory: Effort normal.  Musculoskeletal:        General: Normal range of motion.     Cervical back: Normal range of motion.  Neurological: She is  alert and oriented to person, place, and time.  Psychiatric: She has a normal mood and affect. Her behavior is normal.    Prenatal labs: ABO, Rh: --/--/O POS, O POS Performed at [facility], [address] [place], [place] [address]  [phone]) Antibody: NEG (05/05 0913) Rubella: Immune (11/03 0857) RPR: NON REACTIVE (05/05 0915)  HBsAg: Negative (11/03 0858)  HIV: Non-reactive (11/03 2725)  GBS: Negative/-- (04/21 1142)   Assessment/Plan: She elects repeat cesarean section. The risks of cesarean section discussed with the patient included but were not limited to: bleeding which may require transfusion or reoperation; infection which may require antibiotics; injury to bowel, bladder, ureters or other surrounding organs; injury to the fetus; need for additional procedures including hysterectomy in the event of a life-threatening hemorrhage; placental abnormalities wth subsequent pregnancies, incisional problems, thromboembolic phenomenon and other postoperative/anesthesia complications. The patient concurred with the proposed plan, giving informed written consent for the procedure.   Patient has been NPO since 0000 she will remain NPO for procedure. Anesthesia and OR aware. Preoperative prophylactic antibiotics and SCDs ordered on call to the OR.  To OR when ready.     Scheryl Darter 10/31/2019, 1:11 PM

## 2019-10-31 NOTE — Anesthesia Procedure Notes (Signed)
Spinal  Patient location during procedure: OR Start time: 10/31/2019 1:48 PM End time: 10/31/2019 1:51 PM Staffing Performed: anesthesiologist  Anesthesiologist: Kaylyn Layer, MD Preanesthetic Checklist Completed: patient identified, IV checked, risks and benefits discussed, monitors and equipment checked, pre-op evaluation and timeout performed Spinal Block Patient position: sitting Prep: DuraPrep and site prepped and draped Patient monitoring: heart rate, continuous pulse ox and blood pressure Approach: midline Location: L3-4 Injection technique: single-shot Needle Needle type: Pencan  Needle gauge: 24 G Needle length: 10 cm Assessment Sensory level: T4 Additional Notes Risks, benefits, and alternative discussed. Patient gave consent to procedure. Prepped and draped in sitting position. Clear CSF obtained after one needle pass. Positive terminal aspiration. No pain or paraesthesias with injection. Patient tolerated procedure well. Vital signs stable. Amalia Greenhouse, MD

## 2019-10-31 NOTE — Discharge Instructions (Signed)
Ch?m [place] sau khi sinh m? Postpartum Care After Cesarean Delivery T? h??ng d?n ny cho qu v? thng tin v? cch ch?m [place] b?n thn t? lc sinh con cho ??n 6-12 tu?n sau khi sinh (th?i k? h?u s?n). Chuyn gia ch?m [place] s?c kh?e c?ng c th? c h??ng d?n c? th? h?n cho qu v?. Hy lin l?c v?i chuyn gia ch?m [place] s?c kh?e n?u qu v? c cc v?n ?? ho?c th?c m?c. Tun th? nh?ng h??ng d?n ny ? nh: Thu?c  Ch? s? d?ng thu?c khng k ??n v thu?c k ??n theo ch? d?n c?a chuyn gia ch?m [place] s?c kh?e.  N?u qu v? ???c k thu?c khng sinh, hy dng thu?c theo ch? d?n c?a chuyn gia ch?m [place] s?c kh?e. Khng d?ng s? d?ng thu?c khng sinh ngay c? khi qu v? b?t ??u c?m th?y ?? h?n.  Hy h?i chuyn gia ch?m [place] s?c kh?e xem thu?c ???c k ??n cho qu v?: ? C c?n qu v? ph?i trnh li xe ho?c s? d?ng my mc h?ng n?ng hay khng. ? C th? gy to bn hay khng. Qu v? c th? c?n th?c hi?n cc hnh ??ng ?? ng?n ng?a ho?c ?i?u tr? to bn, ch?ng h?n nh?:  U?ng ?? n??c ?? gi? cho n??c ti?u c mu vng nh?t.  Dng thu?c khng k ??n ho?c thu?c k ??n.  ?n th?c ?n giu ch?t x? nh? ??u, ng? c?c nguyn cm, tri cy t??i v rau.  H?n ch? cc lo?i th?c ?n giu ch?t bo v ???ng tinh luy?n, ch?ng h?n nh? ?? ?n chin/rn ho?c ?? ng?t. Ho?t ??ng  D?n tr? l?i sinh ho?t bnh th??ng theo ch? d?n c?a chuyn gia ch?m [place] s?c kh?e.  Trnh cc ho?t ??ng c?n nhi?u n? l?c v n?ng l??ng (?i h?i g?ng s?c) cho ??n khi chuyn gia ch?m [place] s?c kh?e ch?p thu?n. ?i b? v?i t?c ?? t? ch?m ??n trung bnh th??ng l an ton. Hy h?i chuyn gia ch?m [place] s?c kh?e v? cc ho?t ??ng no l an ton cho qu v?. ? Khng nng b?t ky? v?t gi? n??ng h?n con quy? vi? ho??c qu 10 lb (4,5 kg) theo ch? d?n c?a chuyn gia ch?m so?c s??c kho?e. ? Khng ht b?i, ln c?u thang ho?c li xe h?i trong th?i gian theo ch? d?n c?a chuyn gia ch?m  s?c kh?e.  N?u c th?, nh? ai ? gip qu v? ? nh cho ??n khi qu v? c th? t? lm cc ho?t ??ng bnh  th??ng.  Ngh? ng?i cng nhi?u cng t?t. C? g??ng nghi? ng?i ho?c ngu? ch??p m??t khi con quy? vi? ngu?Marland Kitchen Ch?y mu m ??o  Ch?y mu m ??o (s?n d?ch) sau khi sinh l bnh th??ng. Mang b?ng v? sinh ?? th?m mu v d?ch m ??o. ? Trong tu?n ??u tin sau khi sinh, l??ng v mu s?c c?a s?n d?ch th??ng t??ng t? v?i kinh nguy?t. ? Trong vi tu?n ti?p theo, n s? gi?m d?n thnh ch?t ti?t kh, mu vng nu. ? ??i v?i h?u h?t ph? n?, s?n d?ch ng?ng hon ton sau sinh 4-6 tu?n. Hi?n t??ng ch?y mu m ??o c th? khc nhau gi?a nh?ng ng??i khc nhau.  Thay b?ng v? sinh th??ng xuyn. Theo di b?t c? thay ??i no v? vi?c ra mu, ch?ng h?n nh?: ? ??t ng?t t?ng l??ng mu ch?y ra. ? Thay ??i mu s?c. ? C?c mu ?ng l?n.  N?u quy? vi? co? m?t cu?c  ma?u ?ng tri t? m ??o ra, ha?y gi?? n la?i v g?i cho chuyn gia ch?m so?c s??c kho?e ?? bn b?c. Khng x? c?c mu ?ng xu?ng b?n c?u tr??c khi qu v? c ch? d?n c?a chuyn gia ch?m sc s?c kh?e.  Khng s?? du?ng nu?t v? sinh ho??c thu?t r??a cho ??n khi chuyn gia ch?m so?c s??c kho?e no?i vi?c na?y la? an toa?n.  N?u qu v? khng nui con b?ng s?a m?, qu v? s? c kinh nguy?t tr? l?i trong vng 6-8 tu?n sau khi sinh con. N?u qu v? nui con b?ng s?a m?, qu v? c th? c kinh nguy?t tr? l?i vo b?t k? lc no t? lc 8 tu?n sau khi sinh con ??n lc qu v? d?ng nui con b?ng s?a m?. Ch?m sc vng ?y ch?u   N?u vi?c sinh m? (Sinh m?) khng ???c ln k? ho?ch v qu v? ???c php tr? d? v r?n tr??c khi sinh, qu v? c th? b? ?au, s?ng v c?m gic kh ch?u ? m gi?a l? m ??o v h?u mn (t?ng sinh mn). Qu v? c?ng c th? c v?t m? r?ch ? m (th? thu?t c?t t?ng sinh mn) ho?c m b? rch trong qu trnh sinh. Tun th? nh?ng h??ng d?n ny theo ch? d?n c?a chuyn gia ch?m sc s?c kh?e: ? Gi? cho t?ng sinh mn s?ch v kh theo ch? d?n c?a chuyn gia ch?m sc s?c kh?e. S? d?ng b?ng t?m thu?c v thu?c x?t v kem c thu?c gi?m ?au theo ch? d?n. ? N?u quy? vi? c ???c  lm th? thu?t c?t t?ng sinh mn ho??c bi? ra?ch m ?a?o, ha?y ki?m tra khu v??c ?o? m?i nga?y xem co? d?u hi?u nhi?m tru?ng khng. Ki?m tra xem c:  ??, s?ng n? ho?c ?au.  Di?ch ho?c mu.  ?m.  M? ho?c mi hi. ? Qu v? c th? ???c cung c?p m?t bnh phun ?? s? d?ng thay v lau ?? lm s?ch vng t?ng sinh mn sau khi ?i v? sinh. Khi qu v? b?t ??u lnh l?i, qu v? c th? s? d?ng bnh phun tr??c khi t? lau. ??m b?o lau nh? nhng. ? ?? gi?m ?au do th? thu?t c?t t?ng sinh mn, rch m ??o, ho?c b?nh tr?, hy c? g?ng ngm mng trong n??c ?m 2-3 l?n m?i ngy. Ngm mng l ng?i vo trong m?t ch?u n??c ?m. N??c ch? c?n ng?p ??n hng v c?n ph?i ng?p mng qu v?. Ch?m sc v  Trong vng vi ngy ??u tin sau sinh, v c?a qu v? c th? c?m gic n?ng, c?ng v c?m gic kh ch?u (c??ng s?a). Qu v? c?ng c th? c s?a r? ra t? v. Chuyn gia ch?m sc s?c kh?e c th? g?i  cch gip gi?m c?m gic kh ch?u ? v. C??ng s?a c?n h?t trong vng m?t vi ngy.  N?u quy? vi? ?ang cho con b: ? M??c a?o ng??c h? tr?? cho vu? va? v??a v??n v??i quy? vi?. ? Gi? cho nm v c?a qu v? s?ch v kh. Bi kem ho?c thu?c m? theo ch? d?n c?a chuyn gia ch?m sc s?c kh?e. ? Qu v? c th? c?n s? d?ng mi?ng lt ng?c ?? th?m s?a r? ra. ? Qu v? c th? c cc c?n co t? cung m?i l?n cho con b vi tu?n sau sinh. Cc c?n co t? cung gip cho t? cung tr? v? kch th??c bnh th??ng c?a n. ? N?u qu v?   c b?t k? v?n ?? no v?i vi?c cho con b, hy lm vi?c v?i chuyn gia ch?m [place] s?c kh?e ho?c chuyn gia t? v?n nui con b?ng s?a m?.  N?u qu v? khng cho con b: ? Trnh ch?m vo v v ?i?u ny c th? khi?n v c?a t?o ra nhi?u s?a h?n. ? M?c o ng?c v?a v?n v s? d?ng ti ch??m l?nh ?? gip gi?m s?ng. ? Khng v?t (n?n) s?a. Vi?c ny khi?n qu v? t?o ra nhi?u s?a h?n. Quan h? thn m?t v tnh d?c  H?i chuyn gia ch?m so?c s??c kho?e cu?a quy? vi? v? vi?c khi na?o quy? vi? co? th? quan h? ti?nh du?c. ?i?u na?y c th? phu?  thu?c va?o: ? Nguy c? nhi?m tru?ng. ? T? l? lnh l?i. ? C?m gic thoa?i ma?i va? mong mu?n quan h? ti?nh du?c.  Qu v? c th? c thai sau khi sinh con, ngay c? khi qu v? ch?a th?y kinh nguy?t. N?u mu?n, hy trao ??i v?i chuyn gia ch?m [place] s?c kh?e v? cc ph??ng php k? ho?ch ha gia ?nh v ng?a [place] (trnh [place] L?i s?ng  Khng s? d?ng b?t k? s?n ph?m no c nicotine ho?c thu?c l, ch?ng ha?n nh? thu?c l d?ng ht, thu?c l ?i?n t? v thu?c l d?ng nhai. N?u qu v? c?n gip ?? ?? cai thu?c, hy h?i chuyn gia ch?m [place] s?c kh?e.  Khng u?ng bia r??u, ??c bi?t l n?u qu v? ?ang cho con b. ?n v u?ng   U?ng ?? n??c ?? gi? cho n??c ti?u c mu vng nh?t.  ?n ca?c th??c ph?m nhi?u ch?t x? m?i nga?y. Nh? th? co? th? gip ng?n ng?a ho??c gia?m ta?o bo?n. Th??c ph?m nhi?u ch?t x? bao g?m: ? Ngu? c?c va? ba?nh mi? nguyn h?t. ? Ga?o l??t. ? ??u. ? Tri cy v rau c? t??i.  U?ng cc vitamin tr??c khi sinh c?a qu v? cho ??n khi khm s?c kh?e h?u s?n ho?c cho ??n khi chuyn gia ch?m [place] s?c kh?e ni qu v? c th? d?ng u?ng cc vitamin ?Marland Kitchen H??ng d?n chung  Tun th? t?t c? cc l?n khm theo di cho quy? vi? va? con quy? vi? theo ch? d?n c?a chuyn gia ch?m [place] s?c kh?e. H?u h?t ph? n? ?i g?p chuyn gia ch?m [place] s?c kh?e ?? khm s?c kh?e h?u s?n trong vng 3-6 tu?n ??u sau khi sinh. Hy lin l?c v?i chuyn gia ch?m [place] s?c kh?e n?u qu v?:  C?m th?y khng th? ??i ph v?i nh?ng thay ??i m m?t ??a tr? m?i mang l?i cho cu?c s?ng c?a qu v? v nh?ng c?m gic ny khng h?t.  Ca?m th?y bu?n ho??c lo l??ng b?t th???ng.  C v b? ?au, c??ng ho??c chuy?n sang mu ?o?.  B? s?t.  G?p kh kh?n v?i vi?c nh?n ti?u ho?c gi? ?? khng b? sn ti?u.  t quan tm ho??c [employer] tm ??n ca?c hoa?t ??ng ma? quy? vi? t??ng thi?ch thu?.  Khng cho con bu? m?t cht no va? quy? vi? ch?a th?y kinh trong 12 tu?n sau khi sinh con.  Ng??ng cho con bu? va? quy? vi? ch?a th?y kinh trong  12 tu?n sau khi ng??ng cho con bu?.  C cu ho?i v? ch?m so?c ba?n thn ho?c ch?m  con quy? vi?.  C c?c mu ?ng tri t? m ??o ra. Yu c?u tr? gip ngay l?p t?c n?u qu v?:  B? ?au ng?c.  Kh  th?.  B? ?au chn r?t nhi?u, ??t ng?t.  B? ?au r?t nhi?u ho?c co th?t ? b?ng.  Bi? cha?y ma?u ?? m ?a?o nhi?u ??n m??c th?m ???t h?n m?t mi?ng b?ng v? sinh trong m?t gi??. Ch?y mu khng ???c nhi?u h?n giai ?o?n n?ng nh?t c?a qu v?.  B? ?au ??u d? d?i.  Ng?t x?u.  B? m? m?t ho?c nhi?n th?y ??m.  C d?ch m ??o mi kh ch?u.  C  nghi? v? vi?c la?m t?n th??ng ba?n thn ho?c con quy? vi?. N?u qu v? ? t?ng c?m th?y nh? c th? t? lm th??ng t?n b?n thn ho?c ng??i khc, ho?c c suy ngh? v? vi?c t? t?, hy yu c?u s? tr? gip ngay l?p t?c. Qu v? c th? ??n phng c?p c?u g?n nh?t ho?c g?i cho:  D?ch v? c?p c?u t?i ??a ph??ng (911 ? [place]  ???ng dy tr? gip kh?ng ho?ng t? t?, nh? l National Suicide Prevention Lifeline (???ng dy C?u sinh Qu?c gia Ng?n ng?a T? t?) theo s? [phone]. ???ng dy ny ho?t ??ng 24 gi? m?i ngy. Tm t?t  Kho?ng th?i gian t? khi qu v? sinh em b cho ??n 6-12 tu?n sau khi sinh ???c g?i l th?i k? h?u s?n.  D?n tr? l?i sinh ho?t bnh th??ng theo ch? d?n c?a chuyn gia ch?m [place] s?c kh?e.  Tun th? t?t c? cc l?n khm theo di cho quy? vi? va? con quy? vi? theo ch? d?n c?a chuyn gia ch?m [place] s?c kh?e. Thng tin ny khng nh?m m?c ?ch thay th? cho l?i khuyn m chuyn gia ch?m [place] s?c kh?e ni v?i qu v?. Hy b?o ??m qu v? ph?i th?o lu?n b?t k? v?n ?? g m qu v? c v?i chuyn gia ch?m  s?c kh?e c?a qu v?. Document Revised: 03/06/2018 Document Reviewed: 03/06/2018 Elsevier Patient Education  2020 [employer].

## 2019-10-31 NOTE — Discharge Summary (Signed)
Postpartum Discharge Summary    Patient Name: Kristi [name] DOB: 20-Mar-1991 MRN: [id number]  Date of admission: 10/31/2019 Delivering Provider: Wood[place] Mode   Date of discharge: 11/02/2019  Admitting diagnosis: Previous cesarean section complicating pregnancy, antepartum condition or complication [P50.932] Cesarean delivery delivered [O82] Intrauterine pregnancy: [redacted]w[redacted]d    Secondary diagnosis:  Active Problems:   Asthma   Gestational diabetes mellitus in pregnancy   Previous cesarean delivery, delivered   Language barrier   Previous cesarean section complicating pregnancy, antepartum condition or complication   Cesarean delivery delivered  Additional problems: n/a     Discharge diagnosis: Term Pregnancy Delivered                                                                                                Post partum procedures:none  Augmentation: None  Complications: None  Hospital course:  Sceduled C/S   29y.o. yo G2P2002 at 347w0das admitted to the hospital 10/31/2019 for scheduled cesarean section with the following indication:Elective Repeat.  Membrane Rupture Time/Date: 2:13 PM ,10/31/2019   Patient delivered a Viable infant.10/31/2019  Details of operation can be found in separate operative note.  Pateint had an uncomplicated postpartum course.  She is ambulating, tolerating a regular diet, passing flatus, and urinating well. Patient is discharged home in stable condition on  11/02/19        Delivery time: 2:13 PM    Magnesium Sulfate received: No BMZ received: No Rhophylac:N/A MMR:N/A Transfusion:No  Physical exam  Vitals:   11/01/19 1525 11/01/19 1931 11/01/19 2232 11/02/19 0531  BP: (!) 89/54 (!) 90/50 102/72 115/71  Pulse: 67 79 81 88  Resp: 18 16 18 16   Temp: 98.1 F (36.7 C) 97.7 F (36.5 C) 98.1 F (36.7 C) 98.2 F (36.8 C)  TempSrc: Oral Oral Oral Oral  SpO2: 99% 100%  100%   General: alert, cooperative and no distress Lochia:  appropriate Uterine Fundus: firm Incision: Dressing is clean, dry, and intact DVT Evaluation: No evidence of DVT seen on physical exam. Labs: Lab Results  Component Value Date   WBC 11.8 (H) 11/01/2019   HGB 9.5 (L) 11/01/2019   HCT 30.6 (L) 11/01/2019   MCV 70.0 (L) 11/01/2019   PLT 146 (L) 11/01/2019   CMP Latest Ref Rng & Units 11/01/2019  Creatinine 0.44 - 1.00 mg/dL 0.57   Edinburgh Score: Edinburgh Postnatal Depression Scale Screening Tool 11/01/2019  I have been able to laugh and see the funny side of things. 0  I have looked forward with enjoyment to things. 0  I have blamed myself unnecessarily when things went wrong. 0  I have been anxious or worried for no good reason. 0  I have felt scared or panicky for no good reason. 0  Things have been getting on top of me. 0  I have been so unhappy that I have had difficulty sleeping. 0  I have felt sad or miserable. 0  I have been so unhappy that I have been crying. 0  The thought of harming myself has occurred to me. 0  EdFlavia Shipperostnatal  Depression Scale Total 0    Discharge instruction: per After Visit Summary and "Baby and Me Booklet".  After visit meds:  Allergies as of 11/02/2019   No Known Allergies     Medication List    STOP taking these medications   Accu-Chek Guide w/Device Kit   accu-chek softclix lancets   Accu-Chek Softclix Lancets lancets   Blood Pressure Kit Devi   glucose blood test strip     TAKE these medications   ferrous sulfate 325 (65 FE) MG tablet Take 1 tablet (325 mg total) by mouth daily with breakfast.   ibuprofen 800 MG tablet Commonly known as: ADVIL Take 1 tablet (800 mg total) by mouth every 6 (six) hours.   prenatal multivitamin Tabs tablet Take 1 tablet by mouth daily.       Diet: routine diet  Activity: Advance as tolerated. Pelvic rest for 6 weeks.   Outpatient follow up:4 weeks Follow up Appt: Future Appointments  Date Time Provider [place]  11/14/2019   8:30 AM [place] [facility]  12/01/2019  8:15 AM [place], Rona Ravens, NP [facility]   Follow up Visit: Follow-up Information    [facility] Follow [facility].   Specialty: Obstetrics and Gynecology Why: in 2 weeks for an incision check Contact information: [address] [place] [address] [phone]         Please schedule this patient for Postpartum visit in: 4 weeks with the following provider: Any provider Virtual ok For C/S patients schedule nurse incision check in weeks 2 weeks: yes High risk pregnancy complicated by: GDM, h/o Cesarean delivery Delivery mode:  CS Anticipated Birth Control:  Condoms PP Procedures needed: Incision check  Schedule Integrated BH visit: no  Newborn Data: Live born female  Birth Weight:  3325g APGAR: 38, 9  Newborn Delivery   Birth date/time: 10/31/2018 14:13:00 Delivery type: C-Section, Low Transverse Trial of labor: No C-section categorization: Repeat      Baby Feeding: Breast Disposition:home with mother   11/02/2019 Wende Mott, CNM

## 2019-10-31 NOTE — Transfer of Care (Signed)
Immediate Anesthesia Transfer of Care Note  Patient: Kristi [name]  Procedure(s) Performed: CESAREAN SECTION (N/A )  Patient Location: PACU  Anesthesia Type:Spinal  Level of Consciousness: awake, alert  and patient cooperative  Airway & Oxygen Therapy: Patient Spontanous Breathing  Post-op Assessment: Report given to RN and Post -op Vital signs reviewed and stable  Post vital signs: Reviewed and stable  Last Vitals:  Vitals Value Taken Time  BP    Temp    Pulse    Resp    SpO2      Last Pain:  Vitals:   10/31/19 1258  TempSrc: Axillary  PainSc: 0-No pain         Complications: No apparent anesthesia complications

## 2019-10-31 NOTE — Op Note (Signed)
Operative Note   SURGERY DATE: 10/31/2019  PRE-OP DIAGNOSIS:  *Pregnancy at 39 weeks *H/o Cesarean delivery *Gestational diabetes  POST-OP DIAGNOSIS:  *Pregnancy at 39 weeks *Cesarean delivery, delivered *Gestational diabetes   PROCEDURE: repeat low transverse cesarean section via pfannenstiel skin incision with triple layer uterine closure  SURGEON: Surgeon(s) and Role:    Adam Phenix, MD - Primary    * Cyla Haluska L, DO - OB Fellow  ASSISTANT: None  ANESTHESIA: spinal  ESTIMATED BLOOD LOSS: 674 mL  DRAINS: 450 mL UOP via indwelling foley  TOTAL IV FLUIDS: 2200 mL crystalloid  VTE PROPHYLAXIS: SCDs to bilateral lower extremities  ANTIBIOTICS: Two grams of Cefazolin were given, within 1 hour of skin incision  SPECIMENS: None  COMPLICATIONS: None  INDICATIONS: Repeat elective  FINDINGS: No intra-abdominal adhesions were noted. Grossly normal uterus, tubes and ovaries. Clear amniotic fluid, cephalic female infant, weight per medical record, APGARs 8/9, intact placenta.  PROCEDURE IN DETAIL: The patient was taken to the operating room where anesthesia was administered and normal fetal heart tones were confirmed. She was then prepped and draped in the normal fashion in the dorsal supine position with a leftward tilt.  After a time out was performed, a pfannensteil skin incision was made with the scalpel and carried through to the underlying layer of fascia. The fascia was then incised at the midline and this incision was extended laterally with the mayo scissors. Attention was turned to the superior aspect of the fascial incision which was grasped with the kocher clamps x 2, tented up and the rectus muscles were dissected off sharply. In a similar fashion the inferior aspect of the fascial incision was grasped with the kocher clamps, tented up and the rectus muscles dissected off with the mayo scissors. The rectus muscles were then separated in the midline and the  peritoneum was entered bluntly. The Alexis retractor was inserted and the vesicouterine peritoneum was identified.  A low transverse hysterotomy was made with the scalpel until the endometrial cavity was breached and the amniotic sac ruptured, yielding clear amniotic fluid. This incision was extended bluntly and the infant's head, shoulders and body were delivered atraumatically.The cord was clamped x 2 and cut, and the infant was handed to the awaiting pediatricians, after delayed cord clamping was done.  The placenta was then gradually expressed from the uterus and then the uterus was cleared of all clots and debris. The hysterotomy was repaired with a running suture of 0 Vicryl. A second imbricating layer of 0 Vicryl suture was then placed. Due to oozing at the hysterotomy site, 1 g TXA was given and a third layer of 0 Monocryl was added to achieve excellent hemostasis.   The hysterotomy and all operative sites were reinspected and excellent hemostasis was noted. Arista was applied for added hemostasis.  The peritoneum was closed with a running stitch of 3-0 Vicryl. The fascia was reapproximated with 0 Vicryl in a simple running fashion bilaterally. The skin was then closed with 4-0 monocryl, in a subcuticular fashion.  The patient  tolerated the procedure well. Sponge, lap, needle, and instrument counts were correct x 2. The patient was transferred to the recovery room awake, alert and breathing independently in stable condition.  Marlowe Alt, DO OB Fellow [facility] [employer] [facility])

## 2019-10-31 NOTE — Anesthesia Postprocedure Evaluation (Signed)
Anesthesia Post Note  Patient: Kristi [name]  Procedure(s) Performed: CESAREAN SECTION (N/A )     Patient location during evaluation: PACU Anesthesia Type: Spinal Level of consciousness: awake and alert and oriented Pain management: pain level controlled Vital Signs Assessment: post-procedure vital signs reviewed and stable Respiratory status: spontaneous breathing, nonlabored ventilation and respiratory function stable Cardiovascular status: blood pressure returned to baseline Postop Assessment: no apparent nausea or vomiting, spinal receding, no headache and no backache Anesthetic complications: no    Last Vitals:  Vitals:   10/31/19 1630 10/31/19 1647  BP: 95/65 100/70  Pulse: 79 70  Resp: (!) 24 18  Temp:  36.7 C  SpO2: 100% 98%    Last Pain:  Vitals:   10/31/19 1647  TempSrc: Oral  PainSc: 0-No pain   Pain Goal:                Epidural/Spinal Function Cutaneous sensation: Normal sensation (10/31/19 1647), Patient able to flex knees: Yes (10/31/19 1647), Patient able to lift hips off bed: Yes (10/31/19 1647), Back pain beyond tenderness at insertion site: No (10/31/19 1647), Progressively worsening motor and/or sensory loss: No (10/31/19 1647), Bowel and/or bladder incontinence post epidural: No (10/31/19 1647)  Kaylyn Layer

## 2019-11-01 LAB — CBC
HCT: 30.6 % — ABNORMAL LOW (ref 36.0–46.0)
Hemoglobin: 9.5 g/dL — ABNORMAL LOW (ref 12.0–15.0)
MCH: 21.7 pg — ABNORMAL LOW (ref 26.0–34.0)
MCHC: 31 g/dL (ref 30.0–36.0)
MCV: 70 fL — ABNORMAL LOW (ref 80.0–100.0)
Platelets: 146 10*3/uL — ABNORMAL LOW (ref 150–400)
RBC: 4.37 MIL/uL (ref 3.87–5.11)
RDW: 15.6 % — ABNORMAL HIGH (ref 11.5–15.5)
WBC: 11.8 10*3/uL — ABNORMAL HIGH (ref 4.0–10.5)
nRBC: 0 % (ref 0.0–0.2)

## 2019-11-01 LAB — CREATININE, SERUM
Creatinine, Ser: 0.57 mg/dL (ref 0.44–1.00)
GFR calc Af Amer: >60 mL/min (ref >60)
GFR calc non Af Amer: >60 mL/min (ref >60)

## 2019-11-01 NOTE — Lactation Note (Addendum)
This note was copied from a baby's chart. Lactation Consultation Note  Patient Name: Kristi [name] [id number] Date: 11/01/2019  Telephone call from RN reporting mom wants someone to help her breastfeed.  Entered room and mom feeding formula.  Mom reports baby was hungry and she could not wait any longer.  Infant took 25 ml of formula mom reports and formula rolling out corners of mouth.  Asked mom if we could try and offer breast for dessert if not we would leave her STS.  Mom agreed. Mom was wearing her breast shells. Mom put her in cradle on the left breast. Infant opened and latched well. But very sleepy and limited nutritive sucking noted..  Held moms breast and as feeding went on and mom reported comfort gradually let the breast go.Left mom and baby breastfeeding.  Urged to always offer the breast first.  Call lactation as needed.    Feeding    LATCH Score                   Interventions    Lactation Tools Discussed/Used     Consult Status      Kristi [name] Michaelle Copas 11/01/2019, 7:11 PM

## 2019-11-01 NOTE — Lactation Note (Signed)
This note was copied from a baby's chart. Lactation Consultation Note  Patient Name: Kristi [name] [id number] Date: 11/01/2019 Reason for consult: Follow-up assessment   P2 mother whose infant is now 27 hours old.  This is a term baby at 39+0 weeks.  Mother breast fed her first child (now 28 years old) for 2 weeks.  Mother's feeding preference on admission was breast/bottle.  [place] interpreter [phone]) used for interpretation.  Mother had recently finished bottle feeding baby when I arrived.  She has not attempted breast feeding since the lactation consultant saw her early this morning.  Mother informed me that she was told there would be "no milk" for 3-5 days.  She also informed me that the baby has a tongue tie and that she told the pediatrician about it.  Mother has only attempted to latch baby one time.  Re-educated on the importance of latching baby to the breast 8-12 times/24 hours or sooner if baby shows feeding cues.  Discussed "supply and demand" and how important that is to help establish a milk supply in the early days after birth.  Also informed her that many babies are able to feed with a tongue tie and it will be important to begin putting baby to the breast prior to giving any formula supplementation.  Suggested mother call her RN/LC for the next feeding to assess latching.  Mother verbalized understanding.  Provided coconut oil for comfort with directions for use.  Mother is familiar with hand expression and colostrum has been obtained.  LC provided breast shells,however, mother is not wearing them.    Mother was given a manual pump.  She does not have a DEBP for home use.  Suggested she call the [facility] office in her county to determine eligibility for obtaining a DEBP.  Mother stated that the [facility] representative told her to call after the baby was born to set up an appointment.  I suggested mother follow through with this on Monday.  No support person here at this time.  RN  updated.     Maternal Data    Feeding Feeding Type: Bottle Fed - Formula Nipple Type: Slow - flow  LATCH Score                   Interventions    Lactation Tools Discussed/Used     Consult Status Consult Status: Follow-up Date: 11/02/19 Follow-up type: In-patient    Dora Sims 11/01/2019, 1:59 PM

## 2019-11-01 NOTE — Progress Notes (Signed)
Post Partum Day 1, rLTCS Subjective: no complaints, tolerating PO and + flatus Patient has not voided since surgery Able to ambulate with assistance   Objective: Blood pressure (!) 91/55, pulse 75, temperature 97.7 F (36.5 C), temperature source Oral, resp. rate 18, last menstrual period 01/31/2019, SpO2 98 %, unknown if currently breastfeeding.  Physical Exam:  General: alert and cooperative Lochia: appropriate Uterine Fundus: firm Incision: healing well, no significant drainage, no dehiscence DVT Evaluation: No evidence of DVT seen on physical exam. No cords or calf tenderness. No significant calf/ankle edema.  Recent Labs    11/01/19 0524  HGB 9.5*  HCT 30.6*    Assessment/Plan: Contraception condoms  Pain well controlled  CBC stable  Consider D/C tomorrow    LOS: 1 day   Kristi [name] 11/01/2019, 10:57 AM

## 2019-11-01 NOTE — Lactation Note (Addendum)
This note was copied from a baby's chart. Lactation Consultation Note Used Interpreter Long [phone] for consult. Mom understands some English but needs interpreter for teaching. Baby 12 hrs old hasn't been to breast. Mom has large pendulous breast w/everted nipples. Mom stated had increased breast size during pregnancy.  Mom has edema to breast. Edema to lower part of breast. Reverse pressure to areola. Areola thick feeling, reverse pressure helpful some. Gave mom shells, encouraged mom to wear bra in am w/shells to lessen edema. Gave mom hand pump d/t she doesn't have one at home.  Newborn behavior, STS, I&O, breast massage, positioning, support, comfort,supply and demand discussed. Mom encouraged to feed baby 8-12 times/24 hours and with feeding cues.   Mom is breast/formula feeding. Encouraged to put baby to the breast first then supplement every 2 1/2-3 hrs.  Hand expression taught w/no colostrum noted. LC feels so much edema to breast may be hard for colostrum to be expressed. Noted when baby cried has high palate anterior tongue tie. Asked mom to show MD.  Mom has 57 yr old that she BF for 2 weeks. Encouraged mom to call for assistance as needed. Lactation brochure given. Reported to RN.  Patient Name: Kristi [name] [id number] Date: 11/01/2019 Reason for consult: Initial assessment;Term   Maternal Data Has patient been taught Hand Expression?: Yes Does the patient have breastfeeding experience prior to this delivery?: Yes  Feeding Feeding Type: Breast Fed Nipple Type: Extra Slow Flow(switched to purple extra slow flow due to infant gagging)  LATCH Score Latch: Grasps breast easily, tongue down, lips flanged, rhythmical sucking.  Audible Swallowing: None  Type of Nipple: Everted at rest and after stimulation  Comfort (Breast/Nipple): Filling, red/small blisters or bruises, mild/mod discomfort(edema)  Hold (Positioning): Assistance needed to correctly position infant at  breast and maintain latch.  LATCH Score: 6  Interventions Interventions: Breast feeding basics reviewed;Support pillows;Assisted with latch;Position options;Skin to skin;Breast massage;Hand express;Shells;Pre-pump if needed;Breast compression;Adjust position;Hand pump;Reverse pressure  Lactation Tools Discussed/Used Tools: Pump;Shells Shell Type: Inverted Breast pump type: Manual WIC Program: Yes Pump Review: Setup, frequency, and cleaning;Milk Storage Initiated by:: Peri Jefferson RN IBCLC Date initiated:: 11/01/19   Consult Status Consult Status: Follow-up Date: 11/02/19 Follow-up type: In-patient    Charyl Dancer 11/01/2019, 2:54 AM

## 2019-11-02 LAB — BIRTH TISSUE RECOVERY COLLECTION (PLACENTA DONATION)

## 2019-11-02 MED ORDER — IBUPROFEN 800 MG PO TABS: 800.0000 mg | ORAL_TABLET | Freq: Four times a day (QID) | ORAL | 0 refills | Status: DC

## 2019-11-03 DIAGNOSIS — Z029 Encounter for administrative examinations, unspecified: Secondary | ICD-10-CM

## 2019-11-07 ENCOUNTER — Other Ambulatory Visit: Payer: Self-pay

## 2019-11-07 ENCOUNTER — Encounter (HOSPITAL_COMMUNITY): Payer: Self-pay | Admitting: Family Medicine

## 2019-11-07 ENCOUNTER — Encounter: Payer: Medicaid Other | Admitting: Obstetrics & Gynecology

## 2019-11-07 ENCOUNTER — Encounter (HOSPITAL_COMMUNITY): Payer: Self-pay | Admitting: Obstetrics & Gynecology

## 2019-11-07 ENCOUNTER — Inpatient Hospital Stay (HOSPITAL_COMMUNITY): Payer: BC Managed Care – PPO

## 2019-11-07 ENCOUNTER — Observation Stay (HOSPITAL_COMMUNITY)
Admission: AD | Admit: 2019-11-07 | Discharge: 2019-11-08 | Disposition: A | Discharge status: Home / Self Care | Payer: BC Managed Care – PPO | Attending: Family Medicine | Admitting: Family Medicine

## 2019-11-07 ENCOUNTER — Inpatient Hospital Stay (EMERGENCY_DEPARTMENT_HOSPITAL)
Admission: AD | Admit: 2019-11-07 | Discharge: 2019-11-07 | Disposition: A | Discharge status: Home / Self Care | Payer: BC Managed Care – PPO | Source: Home / Self Care | Attending: Obstetrics & Gynecology | Admitting: Obstetrics & Gynecology

## 2019-11-07 DIAGNOSIS — D62 Acute posthemorrhagic anemia: Secondary | ICD-10-CM | POA: Diagnosis present

## 2019-11-07 DIAGNOSIS — Z87442 Personal history of urinary calculi: Secondary | ICD-10-CM | POA: Insufficient documentation

## 2019-11-07 DIAGNOSIS — Z79899 Other long term (current) drug therapy: Secondary | ICD-10-CM | POA: Insufficient documentation

## 2019-11-07 DIAGNOSIS — Z8632 Personal history of gestational diabetes: Secondary | ICD-10-CM | POA: Insufficient documentation

## 2019-11-07 DIAGNOSIS — Z8249 Family history of ischemic heart disease and other diseases of the circulatory system: Secondary | ICD-10-CM | POA: Insufficient documentation

## 2019-11-07 DIAGNOSIS — Z20822 Contact with and (suspected) exposure to covid-19: Secondary | ICD-10-CM | POA: Diagnosis not present

## 2019-11-07 DIAGNOSIS — O34219 Maternal care for unspecified type scar from previous cesarean delivery: Secondary | ICD-10-CM | POA: Diagnosis present

## 2019-11-07 LAB — CBC WITH DIFFERENTIAL/PLATELET
Abs Immature Granulocytes: 0.14 10*3/uL — ABNORMAL HIGH (ref 0.00–0.07)
Basophils Absolute: 0.1 10*3/uL (ref 0.0–0.1)
Basophils Relative: 1 %
Eosinophils Absolute: 0.2 10*3/uL (ref 0.0–0.5)
Eosinophils Relative: 2 %
HCT: 34.3 % — ABNORMAL LOW (ref 36.0–46.0)
Hemoglobin: 10.2 g/dL — ABNORMAL LOW (ref 12.0–15.0)
Immature Granulocytes: 1 %
Lymphocytes Relative: 10 %
Lymphs Abs: 1.3 10*3/uL (ref 0.7–4.0)
MCH: 21.3 pg — ABNORMAL LOW (ref 26.0–34.0)
MCHC: 29.7 g/dL — ABNORMAL LOW (ref 30.0–36.0)
MCV: 71.6 fL — ABNORMAL LOW (ref 80.0–100.0)
Monocytes Absolute: 0.7 10*3/uL (ref 0.1–1.0)
Monocytes Relative: 6 %
Neutro Abs: 10 10*3/uL — ABNORMAL HIGH (ref 1.7–7.7)
Neutrophils Relative %: 80 %
Platelets: 182 10*3/uL (ref 150–400)
RBC: 4.79 MIL/uL (ref 3.87–5.11)
RDW: 15.1 % (ref 11.5–15.5)
WBC: 12.4 10*3/uL — ABNORMAL HIGH (ref 4.0–10.5)
nRBC: 0 % (ref 0.0–0.2)

## 2019-11-07 MED ORDER — MISOPROSTOL 200 MCG PO TABS
800.0000 ug | ORAL_TABLET | Freq: Every day | ORAL | 0 refills | Status: DC
Start: 2019-11-07 — End: 2019-11-08

## 2019-11-07 MED ORDER — OXYCODONE HCL 5 MG PO TABS: 5.0000 mg | ORAL_TABLET | Freq: Four times a day (QID) | ORAL | 0 refills | Status: AC | PRN

## 2019-11-07 MED ORDER — METHYLERGONOVINE MALEATE 0.2 MG/ML IJ SOLN
0.2000 mg | Freq: Once | INTRAMUSCULAR | Status: AC
  Administered 2019-11-07: 0.2 mg via INTRAMUSCULAR
  Filled 2019-11-07: qty 1

## 2019-11-07 MED ORDER — LACTATED RINGERS IV BOLUS
1000.0000 mL | Freq: Once | INTRAVENOUS | Status: AC
  Administered 2019-11-07: 1000 mL via INTRAVENOUS

## 2019-11-07 MED ORDER — KETOROLAC TROMETHAMINE 30 MG/ML IJ SOLN
30.0000 mg | Freq: Once | INTRAMUSCULAR | Status: AC
  Administered 2019-11-07: 30 mg via INTRAVENOUS

## 2019-11-07 MED ORDER — KETOROLAC TROMETHAMINE 60 MG/2ML IM SOLN
30.0000 mg | Freq: Once | INTRAMUSCULAR | Status: DC
  Filled 2019-11-07: qty 2

## 2019-11-07 NOTE — MAU Provider Note (Signed)
Chief Complaint: Vaginal Bleeding   First Provider Initiated Contact with Patient 11/07/19 [phone]     *Video Vietnamese interpreter used for this encounter*  SUBJECTIVE HPI: Kristi [name] is a 29 y.o. G2P2002 at 1 week postpartum who presents to Maternity Admissions reporting vaginal bleeding. She had a repeat cesarean section on 5/7.  Reports bleeding increased last night with some dark red blood and blood clots. This morning she saturated 3 pads in 1 hour & bled through her clothes. Also reports increase in abdominal cramping that feels like menstrual cramps. Unknown if she's had fever but does endorse chills. No intercourse since delivery. Denies any other symptoms.   Location: abdomen Quality: cramping Severity: 8/10 on pain scale Duration: 1 day Timing: intermittent Modifying factors: worse when lying still Associated signs and symptoms: vaginal bleeding  Past Medical History:  Diagnosis Date  . Gestational diabetes   . Kidney stones 2015   OB History  Gravida Para Term Preterm AB Living  2 2 2  0 0 2  SAB TAB Ectopic Multiple Live Births  0 0 0 0 2    # Outcome Date GA Lbr Len/2nd Weight Sex Delivery Anes PTL Lv  2 Term 10/31/19 [redacted]w[redacted]d  3325 g F CS-LTranv Spinal  LIV  1 Term 11/08/15 [redacted]w[redacted]d  3155 g M CS-LTranv EPI  LIV   Past Surgical History:  Procedure Laterality Date  . CESAREAN SECTION N/A 11/08/2015   Procedure: CESAREAN SECTION;  Surgeon: 11/10/2015, MD;  Location: [facility] BIRTHING SUITES;  Service: Obstetrics;  Laterality: N/A;  . CESAREAN SECTION N/A 10/31/2019   Procedure: CESAREAN SECTION;  Surgeon: 12/31/2019, MD;  Location: [facility] LD ORS;  Service: Obstetrics;  Laterality: N/A;   Social History   Socioeconomic History  . Marital status: Married    Spouse name: Not on file  . Number of children: Not on file  . Years of education: Not on file  . Highest education level: Some college, no degree  Occupational History  . Occupation: Homemaker  Tobacco Use  .  Smoking status: Never Smoker  . Smokeless tobacco: Never Used  Substance and Sexual Activity  . Alcohol use: No  . Drug use: No  . Sexual activity: Not Currently  Other Topics Concern  . Not on file  Social History Narrative  . Not on file   Social Determinants of Health   Financial Resource Strain:   . Difficulty of Paying Living Expenses:   Food Insecurity: No Food Insecurity  . Worried About Adam Phenix in the Last Year: Never true  . Ran Out of Food in the Last Year: Never true  Transportation Needs: No Transportation Needs  . Lack of Transportation (Medical): No  . Lack of Transportation (Non-Medical): No  Physical Activity:   . Days of Exercise per Week:   . Minutes of Exercise per Session:   Stress:   . Feeling of Stress :   Social Connections:   . Frequency of Communication with Friends and Family:   . Frequency of Social Gatherings with Friends and Family:   . Attends Religious Services:   . Active Member of Clubs or Organizations:   . Attends Programme researcher, broadcasting/film/video Meetings:   Banker Marital Status:   Intimate Partner Violence:   . Fear of Current or Ex-Partner:   . Emotionally Abused:   Marland Kitchen Physically Abused:   . Sexually Abused:    Family History  Problem Relation Age of Onset  . Heart disease Mother   .  Diabetes Mother   . Cancer Mother        stomach  . Hypertension Mother   . Hypertension Father   . Diabetes Father    No current facility-administered medications on file prior to encounter.   Current Outpatient Medications on File Prior to Encounter  Medication Sig Dispense Refill  . ferrous sulfate 325 (65 FE) MG tablet Take 1 tablet (325 mg total) by mouth daily with breakfast. 30 tablet 3  . ibuprofen (ADVIL) 800 MG tablet Take 1 tablet (800 mg total) by mouth every 6 (six) hours. 30 tablet 0  . Prenatal Vit-Fe Fumarate-FA (PRENATAL MULTIVITAMIN) TABS tablet Take 1 tablet by mouth daily.      No Known Allergies  I have reviewed patient's Past  Medical Hx, Surgical Hx, Family Hx, Social Hx, medications and allergies.   Review of Systems  Constitutional: Positive for chills. Negative for fever.  Gastrointestinal: Positive for abdominal pain. Negative for constipation, diarrhea, nausea and vomiting.  Genitourinary: Positive for vaginal bleeding.    OBJECTIVE Patient Vitals for the past 24 hrs:  BP Temp Temp src Pulse Resp SpO2 Height Weight  11/07/19 1430 112/68 -- -- 96 -- -- -- --  11/07/19 1400 105/68 -- -- 80 -- -- -- --  11/07/19 1353 106/68 -- -- 84 15 99 % -- --  11/07/19 1333 112/83 98.1 F (36.7 C) Oral (!) 119 -- -- -- --  11/07/19 1300 114/77 -- -- 72 -- -- -- --  11/07/19 1230 113/69 -- -- 66 -- 97 % -- --  11/07/19 1201 (!) 111/58 -- -- 77 -- 98 % -- --  11/07/19 1131 108/66 -- -- 91 -- -- -- --  11/07/19 1116 99/71 -- -- 97 -- -- -- --  11/07/19 1058 (!) 105/56 98.7 F (37.1 C) Tympanic 76 -- 100 % -- --  11/07/19 1000 (!) 99/59 -- -- 89 -- 96 % -- --  11/07/19 0945 (!) 88/57 -- -- 89 -- 96 % -- --  11/07/19 0934 100/62 -- -- 88 -- -- -- --  11/07/19 0906 113/72 97.7 F (36.5 C) Oral (!) 107 15 100 % -- --  11/07/19 0900 -- -- -- -- -- -- 5\' 2"  (1.575 m) 72.9 kg   Orthostatic VS for the past 24 hrs:  BP- Lying Pulse- Lying BP- Sitting Pulse- Sitting BP- Standing at 0 minutes Pulse- Standing at 0 minutes  11/07/19 1100 98/55 76 100/62 86 101/73 105     Constitutional: Well-developed, well-nourished female in no acute distress.  Cardiovascular: normal rate & rhythm, no murmur Respiratory: normal rate and effort. Lung sounds clear throughout GI: Uterus at umbilicus & TTP. Dressing clean.  Abd soft, non-tender, Pos BS x 4. No guarding or rebound tenderness MS: Extremities nontender, no edema, normal ROM Neurologic: Alert and oriented x 4.  GU:   SPEC EXAM: moderate amount of blood in vagina & several large blood clots removed. Used 10 fox swabs. Unable to visualize cervix. Had to discontinue exam due to  patient discomfort.    LAB RESULTS Results for orders placed or performed during the hospital encounter of 11/07/19 (from the past 24 hour(s))  CBC with Differential/Platelet     Status: Abnormal   Collection Time: 11/07/19  9:35 AM  Result Value Ref Range   WBC 12.4 (H) 4.0 - 10.5 K/uL   RBC 4.79 3.87 - 5.11 MIL/uL   Hemoglobin 10.2 (L) 12.0 - 15.0 g/dL   HCT 11/09/19 (L) 51.0 - 25.8 %  MCV 71.6 (L) 80.0 - 100.0 fL   MCH 21.3 (L) 26.0 - 34.0 pg   MCHC 29.7 (L) 30.0 - 36.0 g/dL   RDW 15.1 11.5 - 15.5 %   Platelets 182 150 - 400 K/uL   nRBC 0.0 0.0 - 0.2 %   Neutrophils Relative % 80 %   Neutro Abs 10.0 (H) 1.7 - 7.7 K/uL   Lymphocytes Relative 10 %   Lymphs Abs 1.3 0.7 - 4.0 K/uL   Monocytes Relative 6 %   Monocytes Absolute 0.7 0.1 - 1.0 K/uL   Eosinophils Relative 2 %   Eosinophils Absolute 0.2 0.0 - 0.5 K/uL   Basophils Relative 1 %   Basophils Absolute 0.1 0.0 - 0.1 K/uL   Immature Granulocytes 1 %   Abs Immature Granulocytes 0.14 (H) 0.00 - 0.07 K/uL  Type and screen     Status: None   Collection Time: 11/07/19  9:35 AM  Result Value Ref Range   ABO/RH(D) O POS    Antibody Screen NEG    Sample Expiration      11/10/2019,2359 Performed at [facility], [place] [address], [place], [place] [address]     IMAGING US PELVIS (TRANSABDOMINAL ONLY)  Result Date: 11/07/2019 CLINICAL DATA:  Postpartum bleeding and cramping since yesterday, post Caesarean section 10/31/2019 EXAM: TRANSABDOMINAL ULTRASOUND OF PELVIS TECHNIQUE: Transabdominal ultrasound examination of the pelvis was performed including evaluation of the uterus, ovaries, adnexal regions, and pelvic cul-de-sac. Transvaginal imaging not performed. COMPARISON:  None FINDINGS: Uterus Measurements: 16.1 x 10.8 x 12.2 cm = volume: 1102 mL. Which enlarged postpartum uterus at. Anteverted. No focal uterine mass. Endometrium Thickness: Markedly thickened endometrial canal up to 80 mm diameter. Heterogeneous material distends  the endometrial canal, with no internal blood flow on color Doppler imaging, likely representing hemorrhage/clot though avascular retained products cannot be excluded. No focal echogenic foci or simple fluid identified. Right ovary Measurements: 2.7 x 1.7 x 3.2 cm = volume: 7.6 mL. Normal morphology without mass Left ovary Measurements: 2.0 x 3.2 x 1.7 cm = volume: 5.7 mL. Normal morphology without mass Other findings:  Trace free pelvic fluid.  No adnexal masses. IMPRESSION: Markedly thickened endometrial canal 80 mm diameter containing extensive heterogeneous avascular internal echogenicity, favor hemorrhage/clot though unable to completely exclude avascular retained products of conception with this appearance. Unremarkable ovaries and adnexa. Electronically Signed   By: Lavonia Dana M.D.   On: 11/07/2019 11:00    MAU COURSE Orders Placed This Encounter  Procedures  . US PELVIS (TRANSABDOMINAL ONLY)  . CBC with Differential/Platelet  . Type and screen  . Saline lock IV  . Discharge patient   Meds ordered this encounter  Medications  . DISCONTD: ketorolac (TORADOL) injection 30 mg  . ketorolac (TORADOL) 30 MG/ML injection 30 mg  . lactated ringers bolus 1,000 mL  . methylergonovine (METHERGINE) injection 0.2 mg  . misoprostol (CYTOTEC) 200 MCG tablet    Sig: Take 4 tablets (800 mcg total) by mouth daily for 2 days.    Dispense:  8 tablet    Refill:  0    Order Specific Question:   Supervising Provider    Answer:   Woodroe Mode [[id number]]  . oxyCODONE (ROXICODONE) 5 MG immediate release tablet    Sig: Take 1 tablet (5 mg total) by mouth every 6 (six) hours as needed for up to 3 days for breakthrough pain.    Dispense:  12 tablet    Refill:  0  Order Specific Question:   Supervising Provider    Answer:   Adam Phenix [[id number]]    MDM Pt is 1 week s/p c/section & presents with vaginal bleeding. Moderate amount of bleeding on exam. Patient hypotensive initially after exam but vital  signs improved with IV fluids.  Hemoglobin stable at 10.2 (was 9.5 last week).   Ultrasound: endometrial thickness 8 cm (80 mm). No blood flow on color doppler  Reviewed ultrasound results and exam with Dr. Debroah Loop. Recommends IM methergine in MAU followed by oral methergine at home. Patient can keep wound check in office next week as appropriate follow up.   Methergine 0.2 mg IM given in MAU. Bleeding stable. Vital signs stable. Patient able to ambulate to bathroom without assistance & reports improvement in symptoms since arrival to MAU.  Her pharmacy does not stock methergine, will send prescription for cytotec.  Using [place] interpreter, stressed reasons to return to MAU including heavy bleeding, syncopal episode, severe pain, or fever.   ASSESSMENT 1. Retained products of conception, postpartum   2. Postpartum bleeding     PLAN Discharge home in stable condition. Rx cycotec 800 mcg x 2 doses Rx roxicodone prn breakthrough pain - patient will use ibuprofen & tylenol primarily Bleeding & infection precautions reviewed   Allergies as of 11/07/2019   No Known Allergies     Medication List    TAKE these medications   ferrous sulfate 325 (65 FE) MG tablet Take 1 tablet (325 mg total) by mouth daily with breakfast.   ibuprofen 800 MG tablet Commonly known as: ADVIL Take 1 tablet (800 mg total) by mouth every 6 (six) hours.   misoprostol 200 MCG tablet Commonly known as: Cytotec Take 4 tablets (800 mcg total) by mouth daily for 2 days.   oxyCODONE 5 MG immediate release tablet Commonly known as: Roxicodone Take 1 tablet (5 mg total) by mouth every 6 (six) hours as needed for up to 3 days for breakthrough pain.   prenatal multivitamin Tabs tablet Take 1 tablet by mouth daily.        Judeth Horn, NP 11/07/2019  3:42 PM

## 2019-11-07 NOTE — MAU Note (Signed)
.   Kristi [name] is a 29 y.o. postpartum c/s here in MAU reporting: She states that yesterday evening around 1600 she had dark red blood clots and this morning at 0630 she changed 3 pads in 1 hour and there was bright red bleeding. She states that her incision looks normal but she is having lower abdominal cramping.   Pain score: 8 Vitals:   11/07/19 0906  BP: 113/72  Pulse: (!) 107  Resp: 15  Temp: 97.7 F (36.5 C)  SpO2: 100%

## 2019-11-07 NOTE — MAU Note (Signed)
Patient states she was sent home from MAU this morning for postpartum bleeding with a prescription but could not fill it at the pharmacy so she went home.  States she saturated 2 pads in an hour and started feeling dizzy so she came back.  Also having lower abdominal pain.  Took pain medicine last at 1800-2000 and the pain is coming back.

## 2019-11-07 NOTE — Discharge Instructions (Signed)
Return to care   If you have heavier bleeding that soaks through more that 2 pads per hour for an hour or more  If you bleed so much that you feel like you might pass out or you do pass out  If you have significant abdominal pain that is not improved with Tylenol   If you develop a fever > 100.5     Ch?m [place] sau khi sinh m? Postpartum Care After Cesarean Delivery T? h??ng d?n ny cho qu v? thng tin v? cch ch?m [place] b?n thn t? lc sinh con cho ??n 6-12 tu?n sau khi sinh (th?i k? h?u s?n). Chuyn gia ch?m [place] s?c kh?e c?ng c th? c h??ng d?n c? th? h?n cho qu v?. Hy lin l?c v?i chuyn gia ch?m [place] s?c kh?e n?u qu v? c cc v?n ?? ho?c th?c m?c. Tun th? nh?ng h??ng d?n ny ? nh: Thu?c  Ch? s? d?ng thu?c khng k ??n v thu?c k ??n theo ch? d?n c?a chuyn gia ch?m [place] s?c kh?e.  N?u qu v? ???c k thu?c khng sinh, hy dng thu?c theo ch? d?n c?a chuyn gia ch?m [place] s?c kh?e. Khng d?ng s? d?ng thu?c khng sinh ngay c? khi qu v? b?t ??u c?m th?y ?? h?n.  Hy h?i chuyn gia ch?m [place] s?c kh?e xem thu?c ???c k ??n cho qu v?: ? C c?n qu v? ph?i trnh li xe ho?c s? d?ng my mc h?ng n?ng hay khng. ? C th? gy to bn hay khng. Qu v? c th? c?n th?c hi?n cc hnh ??ng ?? ng?n ng?a ho?c ?i?u tr? to bn, ch?ng h?n nh?:  U?ng ?? n??c ?? gi? cho n??c ti?u c mu vng nh?t.  Dng thu?c khng k ??n ho?c thu?c k ??n.  ?n th?c ?n giu ch?t x? nh? ??u, ng? c?c nguyn cm, tri cy t??i v rau.  H?n ch? cc lo?i th?c ?n giu ch?t bo v ???ng tinh luy?n, ch?ng h?n nh? ?? ?n chin/rn ho?c ?? ng?t. Ho?t ??ng  D?n tr? l?i sinh ho?t bnh th??ng theo ch? d?n c?a chuyn gia ch?m [place] s?c kh?e.  Trnh cc ho?t ??ng c?n nhi?u n? l?c v n?ng l??ng (?i h?i g?ng s?c) cho ??n khi chuyn gia ch?m [place] s?c kh?e ch?p thu?n. ?i b? v?i t?c ?? t? ch?m ??n trung bnh th??ng l an ton. Hy h?i chuyn gia ch?m [place] s?c kh?e v? cc ho?t ??ng no l an ton cho qu v?. ? Khng nng b?t ky? v?t  gi? n??ng h?n con quy? vi? ho??c qu 10 lb (4,5 kg) theo ch? d?n c?a chuyn gia ch?m so?c s??c kho?e. ? Khng ht b?i, ln c?u thang ho?c li xe h?i trong th?i gian theo ch? d?n c?a chuyn gia ch?m [place] s?c kh?e.  N?u c th?, nh? ai ? gip qu v? ? nh cho ??n khi qu v? c th? t? lm cc ho?t ??ng bnh th??ng.  Ngh? ng?i cng nhi?u cng t?t. C? g??ng nghi? ng?i ho?c ngu? ch??p m??t khi con quy? vi? ngu?Marland Kitchen Ch?y mu m ??o  Ch?y mu m ??o (s?n d?ch) sau khi sinh l bnh th??ng. Mang b?ng v? sinh ?? th?m mu v d?ch m ??o. ? Trong tu?n ??u tin sau khi sinh, l??ng v mu s?c c?a s?n d?ch th??ng t??ng t? v?i kinh nguy?t. ? Trong vi tu?n ti?p theo, n s? gi?m d?n thnh ch?t ti?t kh, mu vng nu. ? ??i v?i h?u h?t ph? n?, s?n  d?ch ng?ng hon ton sau sinh 4-6 tu?n. Hi?n t??ng ch?y mu m ??o c th? khc nhau gi?a nh?ng ng??i khc nhau.  Thay b?ng v? sinh th??ng xuyn. Theo di b?t c? thay ??i no v? vi?c ra mu, ch?ng h?n nh?: ? ??t ng?t t?ng l??ng mu ch?y ra. ? Thay ??i mu s?c. ? C?c mu ?ng l?n.  N?u quy? vi? co? m?t cu?c ma?u ?ng tri t? m ??o ra, ha?y gi?? n la?i v g?i cho chuyn gia ch?m so?c s??c kho?e ?? bn b?c. Khng x? c?c mu ?ng xu?ng b?n c?u tr??c khi qu v? c ch? d?n c?a chuyn gia ch?m [place] s?c kh?e.  Khng s?? du?ng nu?t v? sinh ho??c thu?t r??a cho ??n khi chuyn gia ch?m so?c s??c kho?e no?i vi?c na?y la? an toa?n.  N?u qu v? khng nui con b?ng s?a m?, qu v? s? c kinh nguy?t tr? l?i trong vng 6-8 tu?n sau khi sinh con. N?u qu v? nui con b?ng s?a m?, qu v? c th? c kinh nguy?t tr? l?i vo b?t k? lc no t? lc 8 tu?n sau khi sinh con ??n lc qu v? d?ng nui con b?ng s?a m?. Ch?m [place] vng ?y ch?u   N?u vi?c sinh m? (Sinh m?) khng ???c ln k? ho?ch v qu v? ???c php tr? d? v r?n tr??c khi sinh, qu v? c th? b? ?au, s?ng v c?m gic kh ch?u ? m gi?a l? m ??o v h?u mn (t?ng sinh mn). Qu v? c?ng c th? c v?t m? r?ch ? m (th? thu?t c?t t?ng  sinh mn) ho?c m b? rch trong qu trnh sinh. Tun th? nh?ng h??ng d?n ny theo ch? d?n c?a chuyn gia ch?m [place] s?c kh?e: ? Gi? cho t?ng sinh mn s?ch v kh theo ch? d?n c?a chuyn gia ch?m [place] s?c kh?e. S? d?ng b?ng t?m thu?c v thu?c x?t v kem c thu?c gi?m ?au theo ch? d?n. ? N?u quy? vi? c ???c lm th? thu?t c?t t?ng sinh mn ho??c bi? ra?ch m ?a?o, ha?y ki?m tra khu v??c ?o? m?i nga?y xem co? d?u hi?u nhi?m tru?ng khng. Ki?m tra xem c:  ??, s?ng n? ho?c ?au.  Di?ch ho?c mu.  ?m.  M? ho?c mi hi. ? Qu v? c th? ???c cung c?p m?t bnh phun ?? s? d?ng thay v lau ?? lm s?ch vng t?ng sinh mn sau khi ?i v? sinh. Khi qu v? b?t ??u lnh l?i, qu v? c th? s? d?ng bnh phun tr??c khi t? lau. ??m b?o lau nh? nhng. ? ?? gi?m ?au do th? thu?t c?t t?ng sinh mn, rch m ??o, ho?c b?nh tr?, hy c? g?ng ngm mng trong n??c ?m 2-3 l?n m?i ngy. Ngm mng l ng?i vo trong m?t ch?u n??c ?m. N??c ch? c?n ng?p ??n hng v c?n ph?i ng?p mng qu v?. Ch?m [place] v  Trong vng vi ngy ??u tin sau sinh, v c?a qu v? c th? c?m gic n?ng, c?ng v c?m gic kh ch?u (c??ng s?a). Qu v? c?ng c th? c s?a r? ra t? v. Chuyn gia ch?m [place] s?c kh?e c th? g?i  cch gip gi?m c?m gic kh ch?u ? v. C??ng s?a c?n h?t trong vng m?t vi ngy.  N?u quy? vi? ?ang cho con b: ? M??c a?o ng??c h? tr?? cho vu? va? v??a v??n v??i quy? vi?. ? Gi? cho nm v c?a qu v? s?ch v kh. Bi kem ho?c  thu?c m? theo ch? d?n c?a chuyn gia ch?m [place] s?c kh?e. ? Qu v? c th? c?n s? d?ng mi?ng lt ng?c ?? th?m s?a r? ra. ? Qu v? c th? c cc c?n co t? cung m?i l?n cho con b vi tu?n sau sinh. Cc c?n co t? cung gip cho t? cung tr? v? kch th??c bnh th??ng c?a n. ? N?u qu v? c b?t k? v?n ?? no v?i vi?c cho con b, hy lm vi?c v?i chuyn gia ch?m [place] s?c kh?e ho?c chuyn gia t? v?n nui con b?ng s?a m?.  N?u qu v? khng cho con b: ? Trnh ch?m vo v v ?i?u ny c th? khi?n v c?a t?o ra nhi?u s?a  h?n. ? M?c o ng?c v?a v?n v s? d?ng ti ch??m l?nh ?? gip gi?m s?ng. ? Khng v?t (n?n) s?a. Vi?c ny khi?n qu v? t?o ra nhi?u s?a h?n. Quan h? thn m?t v tnh d?c  H?i chuyn gia ch?m so?c s??c kho?e cu?a quy? vi? v? vi?c khi na?o quy? vi? co? th? quan h? ti?nh du?c. ?i?u na?y c th? phu? thu?c va?o: ? Nguy c? nhi?m tru?ng. ? T? l? lnh l?i. ? C?m gic thoa?i ma?i va? mong mu?n quan h? ti?nh du?c.  Qu v? c th? c thai sau khi sinh con, ngay c? khi qu v? ch?a th?y kinh nguy?t. N?u mu?n, hy trao ??i v?i chuyn gia ch?m [place] s?c kh?e v? cc ph??ng php k? ho?ch ha gia ?nh v ng?a [place] (trnh [place]). L?i s?ng  Khng s? d?ng b?t k? s?n ph?m no c nicotine ho?c thu?c l, ch?ng ha?n nh? thu?c l d?ng ht, thu?c l ?i?n t? v thu?c l d?ng nhai. N?u qu v? c?n gip ?? ?? cai thu?c, hy h?i chuyn gia ch?m [place] s?c kh?e.  Khng u?ng bia r??u, ??c bi?t l n?u qu v? ?ang cho con b. ?n v u?ng   U?ng ?? n??c ?? gi? cho n??c ti?u c mu vng nh?t.  ?n ca?c th??c ph?m nhi?u ch?t x? m?i nga?y. Nh? th? co? th? gip ng?n ng?a ho??c gia?m ta?o bo?n. Th??c ph?m nhi?u ch?t x? bao g?m: ? Ngu? c?c va? ba?nh mi? nguyn h?t. ? Ga?o l??t. ? ??u. ? Tri cy v rau c? t??i.  U?ng cc vitamin tr??c khi sinh c?a qu v? cho ??n khi khm s?c kh?e h?u s?n ho?c cho ??n khi chuyn gia ch?m [place] s?c kh?e ni qu v? c th? d?ng u?ng cc vitamin ?Marland Kitchen H??ng d?n chung  Tun th? t?t c? cc l?n khm theo di cho quy? vi? va? con quy? vi? theo ch? d?n c?a chuyn gia ch?m [place] s?c kh?e. H?u h?t ph? n? ?i g?p chuyn gia ch?m [place] s?c kh?e ?? khm s?c kh?e h?u s?n trong vng 3-6 tu?n ??u sau khi sinh. Hy lin l?c v?i chuyn gia ch?m [place] s?c kh?e n?u qu v?:  C?m th?y khng th? ??i ph v?i nh?ng thay ??i m m?t ??a tr? m?i mang l?i cho cu?c s?ng c?a qu v? v nh?ng c?m gic ny khng h?t.  Ca?m th?y bu?n ho??c lo l??ng b?t th???ng.  C v b? ?au, c??ng ho??c chuy?n sang mu ?o?.  B?  s?t.  G?p kh kh?n v?i vi?c nh?n ti?u ho?c gi? ?? khng b? sn ti?u.  t quan tm ho??c [employer] tm ??n ca?c hoa?t ??ng ma? quy? vi? t??ng thi?ch thu?.  Khng cho con bu? m?t cht no va? quy? vi? ch?a th?y  kinh trong 12 tu?n sau khi sinh con.  Ng??ng cho con bu? va? quy? vi? ch?a th?y kinh trong 12 tu?n sau khi ng??ng cho con bu?.  C cu ho?i v? ch?m so?c ba?n thn ho?c ch?m [place] con quy? vi?.  C c?c mu ?ng tri t? m ??o ra. Yu c?u tr? gip ngay l?p t?c n?u qu v?:  B? ?au ng?c.  Kh th?.  B? ?au chn r?t nhi?u, ??t ng?t.  B? ?au r?t nhi?u ho?c co th?t ? b?ng.  Bi? cha?y ma?u ?? m ?a?o nhi?u ??n m??c th?m ???t h?n m?t mi?ng b?ng v? sinh trong m?t gi??. Ch?y mu khng ???c nhi?u h?n giai ?o?n n?ng nh?t c?a qu v?.  B? ?au ??u d? d?i.  Ng?t x?u.  B? m? m?t ho?c nhi?n th?y ??m.  C d?ch m ??o mi kh ch?u.  C  nghi? v? vi?c la?m t?n th??ng ba?n thn ho?c con quy? vi?. N?u qu v? ? t?ng c?m th?y nh? c th? t? lm th??ng t?n b?n thn ho?c ng??i khc, ho?c c suy ngh? v? vi?c t? t?, hy yu c?u s? tr? gip ngay l?p t?c. Qu v? c th? ??n phng c?p c?u g?n nh?t ho?c g?i cho:  D?ch v? c?p c?u t?i ??a ph??ng (911 ? [place]  ???ng dy tr? gip kh?ng ho?ng t? t?, nh? l National Suicide Prevention Lifeline (???ng dy C?u sinh Qu?c gia Ng?n ng?a T? t?) theo s? [phone]. ???ng dy ny ho?t ??ng 24 gi? m?i ngy. Tm t?t  Kho?ng th?i gian t? khi qu v? sinh em b cho ??n 6-12 tu?n sau khi sinh ???c g?i l th?i k? h?u s?n.  D?n tr? l?i sinh ho?t bnh th??ng theo ch? d?n c?a chuyn gia ch?m [place] s?c kh?e.  Tun th? t?t c? cc l?n khm theo di cho quy? vi? va? con quy? vi? theo ch? d?n c?a chuyn gia ch?m [place] s?c kh?e. Thng tin ny khng nh?m m?c ?ch thay th? cho l?i khuyn m chuyn gia ch?m [place] s?c kh?e ni v?i qu v?. Hy b?o ??m qu v? ph?i th?o lu?n b?t k? v?n ?? g m qu v? c v?i chuyn gia ch?m [place] s?c kh?e c?a qu v?. Document Revised:  03/06/2018 Document Reviewed: 03/06/2018 Elsevier Patient Education  2020 [employer].

## 2019-11-07 NOTE — MAU Provider Note (Addendum)
History     CSN: [id number]  Arrival date and time: 11/07/19 2258   First Provider Initiated Contact with Patient 11/07/19 2358      Chief Complaint  Patient presents with  . Vaginal Bleeding   Kristi [name] is a 29 y.o. G2P2002 at one week postpartum who receives care at [facility].  She presents today for Vaginal Bleeding.  She was seen earlier today with the same complaint.  She states that 2 hours ago she was having heavy bleeding and felt like she was going to pass out.  However, she states that the bleeding is now lighter.  She reports that she was unable to obtain her prescription due to product availability at the moment.  She states the pharmacy attempted to contact someone, but was unsuccessful.   She states that she has been needing to change pads twice in one hour, but did not notice any clots. Patient also reports some abdominal cramping and contractions.  She endorses chills and denies fever.  She also denies pain or difficulty with urination.      OB History    Gravida  2   Para  2   Term  2   Preterm  0   AB  0   Living  2     SAB  0   TAB  0   Ectopic  0   Multiple  0   Live Births  2           Past Medical History:  Diagnosis Date  . Gestational diabetes   . Kidney stones 2015    Past Surgical History:  Procedure Laterality Date  . CESAREAN SECTION N/A 11/08/2015   Procedure: CESAREAN SECTION;  Surgeon: Lazaro Arms, MD;  Location: [facility] BIRTHING SUITES;  Service: Obstetrics;  Laterality: N/A;  . CESAREAN SECTION N/A 10/31/2019   Procedure: CESAREAN SECTION;  Surgeon: Adam Phenix, MD;  Location: [facility] LD ORS;  Service: Obstetrics;  Laterality: N/A;    Family History  Problem Relation Age of Onset  . Heart disease Mother   . Diabetes Mother   . Cancer Mother        stomach  . Hypertension Mother   . Hypertension Father   . Diabetes Father     Social History   Tobacco Use  . Smoking status: Never Smoker  . Smokeless tobacco: Never  Used  Substance Use Topics  . Alcohol use: No  . Drug use: No    Allergies: No Known Allergies  Medications Prior to Admission  Medication Sig Dispense Refill Last Dose  . oxyCODONE (ROXICODONE) 5 MG immediate release tablet Take 1 tablet (5 mg total) by mouth every 6 (six) hours as needed for up to 3 days for breakthrough pain. 12 tablet 0 11/07/2019 at 2000  . ferrous sulfate 325 (65 FE) MG tablet Take 1 tablet (325 mg total) by mouth daily with breakfast. 30 tablet 3   . ibuprofen (ADVIL) 800 MG tablet Take 1 tablet (800 mg total) by mouth every 6 (six) hours. 30 tablet 0   . misoprostol (CYTOTEC) 200 MCG tablet Take 4 tablets (800 mcg total) by mouth daily for 2 days. 8 tablet 0   . Prenatal Vit-Fe Fumarate-FA (PRENATAL MULTIVITAMIN) TABS tablet Take 1 tablet by mouth daily.        Review of Systems  Constitutional: Positive for chills. Negative for fever.  Respiratory: Negative for cough and shortness of breath.   Gastrointestinal: Positive for abdominal pain.  Negative for nausea and vomiting.  Genitourinary: Positive for vaginal bleeding. Negative for difficulty urinating, dysuria and vaginal discharge.  Neurological: Positive for dizziness and light-headedness. Negative for headaches.   Physical Exam   Blood pressure 105/66, pulse (!) 114, temperature 98.4 F (36.9 C), resp. rate 17, SpO2 99 %, unknown if currently breastfeeding.  Physical Exam  Constitutional: She is oriented to person, place, and time. She appears well-developed and well-nourished. No distress.  HENT:  Head: Normocephalic and atraumatic.  Eyes: Conjunctivae are normal.  Cardiovascular: Normal rate.  Respiratory: Effort normal.  Genitourinary:    Vaginal bleeding present.  There is bleeding in the vagina.    Genitourinary Comments: Speculum Exam: -Normal External Genitalia: Non tender, Moderate amt blood noted on thighs and small amt at introitus.  -Vaginal Vault: Pink mucosa with good rugae. Moderate  amt blood in vault. Removed with ring forceps and (2) 4x4 gauze for visualization of cervix.  -Cervix: Pink, no lesions, cysts, or polyps.  Appears closed. Small amt active bleeding from os- -Bimanual Exam:  Deferred    Musculoskeletal:     Cervical back: Normal range of motion.  Neurological: She is alert and oriented to person, place, and time.  Skin: Skin is warm and dry. There is pallor.  Psychiatric: She has a normal mood and affect. Her behavior is normal.    MAU Course  Procedures Results for orders placed or performed during the hospital encounter of 11/07/19 (from the past 24 hour(s))  CBC     Status: Abnormal   Collection Time: 11/08/19 12:22 AM  Result Value Ref Range   WBC 10.3 4.0 - 10.5 K/uL   RBC 3.33 (L) 3.87 - 5.11 MIL/uL   Hemoglobin 7.3 (L) 12.0 - 15.0 g/dL   HCT 23.6 (L) 36.0 - 46.0 %   MCV 70.9 (L) 80.0 - 100.0 fL   MCH 21.9 (L) 26.0 - 34.0 pg   MCHC 30.9 30.0 - 36.0 g/dL   RDW 14.9 11.5 - 15.5 %   Platelets 140 (L) 150 - 400 K/uL   nRBC 0.0 0.0 - 0.2 %    MDM Pelvic Exam CBC  Assessment and Plan  29 year old G2P2002 Postpartum State Known Retained Products Language Barrier  -Patient informed of exam findings. -Discussed how CBC will be repeated to assess blood loss. -Informed that if blood loss is significant recommendation would be for D&C. -Patient questions and concerns addressed. -Will await results.  -Will consider repeat US -Long [id number] used for interpretations.      Maryann Conners 11/07/2019, 11:58 PM   Reassessment (1:33 AM)  -Hemoglobin returns at 7.3 which is significant change from 10.2. -Dr Kennon Rounds consulted regarding exam and CBC. Advised: *Admit to [facility] for stabilization to include repeat CBC, T&S with crossmatch, and NPO for plan for D&C in AM.  -Nurse informed of POC.   Maryann Conners MSN, CNM Advanced Practice Provider, [facility]

## 2019-11-08 ENCOUNTER — Encounter (HOSPITAL_COMMUNITY): Payer: Self-pay | Admitting: Family Medicine

## 2019-11-08 ENCOUNTER — Observation Stay (HOSPITAL_COMMUNITY): Payer: BC Managed Care – PPO

## 2019-11-08 ENCOUNTER — Encounter (HOSPITAL_COMMUNITY)
Admission: AD | Disposition: A | Discharge status: Home / Self Care | Payer: Self-pay | Source: Home / Self Care | Attending: Family Medicine

## 2019-11-08 ENCOUNTER — Observation Stay (HOSPITAL_COMMUNITY): Payer: BC Managed Care – PPO | Admitting: Critical Care Medicine

## 2019-11-08 DIAGNOSIS — O9903 Anemia complicating the puerperium: Secondary | ICD-10-CM

## 2019-11-08 DIAGNOSIS — D62 Acute posthemorrhagic anemia: Secondary | ICD-10-CM | POA: Diagnosis present

## 2019-11-08 DIAGNOSIS — Z98891 History of uterine scar from previous surgery: Secondary | ICD-10-CM

## 2019-11-08 HISTORY — PX: DILATION AND EVACUATION: SHX1459

## 2019-11-08 LAB — CBC
HCT: 23.6 % — ABNORMAL LOW (ref 36.0–46.0)
HCT: 21.3 % — ABNORMAL LOW (ref 36.0–46.0)
HCT: 31.6 % — ABNORMAL LOW (ref 36.0–46.0)
Hemoglobin: 7.3 g/dL — ABNORMAL LOW (ref 12.0–15.0)
Hemoglobin: 6.5 g/dL — CL (ref 12.0–15.0)
Hemoglobin: 10.4 g/dL — ABNORMAL LOW (ref 12.0–15.0)
MCH: 21.9 pg — ABNORMAL LOW (ref 26.0–34.0)
MCH: 21.6 pg — ABNORMAL LOW (ref 26.0–34.0)
MCH: 26.5 pg (ref 26.0–34.0)
MCHC: 30.9 g/dL (ref 30.0–36.0)
MCHC: 30.5 g/dL (ref 30.0–36.0)
MCHC: 32.9 g/dL (ref 30.0–36.0)
MCV: 70.9 fL — ABNORMAL LOW (ref 80.0–100.0)
MCV: 70.8 fL — ABNORMAL LOW (ref 80.0–100.0)
MCV: 80.6 fL (ref 80.0–100.0)
Platelets: 140 10*3/uL — ABNORMAL LOW (ref 150–400)
Platelets: 127 10*3/uL — ABNORMAL LOW (ref 150–400)
Platelets: 113 10*3/uL — ABNORMAL LOW (ref 150–400)
RBC: 3.33 MIL/uL — ABNORMAL LOW (ref 3.87–5.11)
RBC: 3.01 MIL/uL — ABNORMAL LOW (ref 3.87–5.11)
RBC: 3.92 MIL/uL (ref 3.87–5.11)
RDW: 14.9 % (ref 11.5–15.5)
RDW: 14.9 % (ref 11.5–15.5)
RDW: 19.7 % — ABNORMAL HIGH (ref 11.5–15.5)
WBC: 10.3 10*3/uL (ref 4.0–10.5)
WBC: 9.6 10*3/uL (ref 4.0–10.5)
WBC: 11.2 10*3/uL — ABNORMAL HIGH (ref 4.0–10.5)
nRBC: 0 % (ref 0.0–0.2)
nRBC: 0 % (ref 0.0–0.2)
nRBC: 0 % (ref 0.0–0.2)

## 2019-11-08 LAB — SARS CORONAVIRUS 2 BY RT PCR (HOSPITAL ORDER, PERFORMED IN ~~LOC~~ HOSPITAL LAB): SARS Coronavirus 2: NEGATIVE

## 2019-11-08 LAB — TYPE AND SCREEN
ABO/RH(D): O POS
Antibody Screen: NEGATIVE

## 2019-11-08 LAB — PREPARE RBC (CROSSMATCH)

## 2019-11-08 SURGERY — DILATION AND EVACUATION, UTERUS
Anesthesia: General

## 2019-11-08 MED ORDER — CARBOPROST TROMETHAMINE 250 MCG/ML IM SOLN
INTRAMUSCULAR | Status: AC
  Filled 2019-11-08: qty 1

## 2019-11-08 MED ORDER — SODIUM CHLORIDE 0.9% IV SOLUTION: Freq: Once | INTRAVENOUS | Status: AC

## 2019-11-08 MED ORDER — KETOROLAC TROMETHAMINE 30 MG/ML IJ SOLN: 30.0000 mg | Freq: Once | INTRAMUSCULAR | Status: DC

## 2019-11-08 MED ORDER — FENTANYL CITRATE (PF) 250 MCG/5ML IJ SOLN
INTRAMUSCULAR | Status: DC | PRN
  Administered 2019-11-08 (×2): 50 ug via INTRAVENOUS
  Administered 2019-11-08: 25 ug via INTRAVENOUS

## 2019-11-08 MED ORDER — PHENYLEPHRINE HCL-NACL 10-0.9 MG/250ML-% IV SOLN
INTRAVENOUS | Status: DC | PRN
Start: 2019-11-08 — End: 2019-11-08
  Administered 2019-11-08: 25 ug/min via INTRAVENOUS

## 2019-11-08 MED ORDER — SENNOSIDES-DOCUSATE SODIUM 8.6-50 MG PO TABS: 1.0000 | ORAL_TABLET | Freq: Every evening | ORAL | Status: DC | PRN

## 2019-11-08 MED ORDER — LIDOCAINE-EPINEPHRINE 1 %-1:100000 IJ SOLN
INTRAMUSCULAR | Status: AC
  Filled 2019-11-08: qty 1

## 2019-11-08 MED ORDER — ONDANSETRON HCL 4 MG PO TABS: 4.0000 mg | ORAL_TABLET | Freq: Four times a day (QID) | ORAL | Status: DC | PRN

## 2019-11-08 MED ORDER — SIMETHICONE 80 MG PO CHEW: 80.0000 mg | CHEWABLE_TABLET | Freq: Four times a day (QID) | ORAL | Status: DC | PRN

## 2019-11-08 MED ORDER — ONDANSETRON HCL 4 MG/2ML IJ SOLN
INTRAMUSCULAR | Status: DC | PRN
  Administered 2019-11-08: 4 mg via INTRAVENOUS

## 2019-11-08 MED ORDER — KETOROLAC TROMETHAMINE 30 MG/ML IJ SOLN
30.0000 mg | Freq: Four times a day (QID) | INTRAMUSCULAR | Status: DC | PRN
  Administered 2019-11-08: 30 mg via INTRAVENOUS

## 2019-11-08 MED ORDER — LACTATED RINGERS IV SOLN: INTRAVENOUS | Status: DC

## 2019-11-08 MED ORDER — PHENYLEPHRINE 40 MCG/ML (10ML) SYRINGE FOR IV PUSH (FOR BLOOD PRESSURE SUPPORT)
PREFILLED_SYRINGE | INTRAVENOUS | Status: DC | PRN
  Administered 2019-11-08: 80 ug via INTRAVENOUS
  Administered 2019-11-08: 40 ug via INTRAVENOUS
  Administered 2019-11-08: 80 ug via INTRAVENOUS
  Administered 2019-11-08: 40 ug via INTRAVENOUS
  Administered 2019-11-08 (×2): 80 ug via INTRAVENOUS
  Administered 2019-11-08: 40 ug via INTRAVENOUS

## 2019-11-08 MED ORDER — DOCUSATE SODIUM 100 MG PO CAPS
100.0000 mg | ORAL_CAPSULE | Freq: Two times a day (BID) | ORAL | Status: DC
  Administered 2019-11-08 (×2): 100 mg via ORAL
  Filled 2019-11-08 (×2): qty 1

## 2019-11-08 MED ORDER — ZOLPIDEM TARTRATE 5 MG PO TABS: 5.0000 mg | ORAL_TABLET | Freq: Every evening | ORAL | Status: DC | PRN

## 2019-11-08 MED ORDER — ACETAMINOPHEN 500 MG PO TABS
1000.0000 mg | ORAL_TABLET | Freq: Four times a day (QID) | ORAL | Status: DC
  Administered 2019-11-08: 1000 mg via ORAL
  Filled 2019-11-08: qty 2

## 2019-11-08 MED ORDER — PROPOFOL 10 MG/ML IV BOLUS
INTRAVENOUS | Status: DC | PRN
  Administered 2019-11-08: 120 mg via INTRAVENOUS
  Administered 2019-11-08: 20 mg via INTRAVENOUS

## 2019-11-08 MED ORDER — SCOPOLAMINE 1 MG/3DAYS TD PT72
1.0000 | MEDICATED_PATCH | Freq: Once | TRANSDERMAL | Status: DC
  Administered 2019-11-08: 1.5 mg via TRANSDERMAL
  Filled 2019-11-08: qty 1

## 2019-11-08 MED ORDER — METHYLERGONOVINE MALEATE 0.2 MG/ML IJ SOLN
INTRAMUSCULAR | Status: DC | PRN
Start: 2019-11-08 — End: 2019-11-08
  Administered 2019-11-08: .2 mg via INTRAMUSCULAR

## 2019-11-08 MED ORDER — FENTANYL CITRATE (PF) 250 MCG/5ML IJ SOLN
INTRAMUSCULAR | Status: AC
  Filled 2019-11-08: qty 5

## 2019-11-08 MED ORDER — PROPOFOL 10 MG/ML IV BOLUS
INTRAVENOUS | Status: AC
  Filled 2019-11-08: qty 20

## 2019-11-08 MED ORDER — LIDOCAINE 2% (20 MG/ML) 5 ML SYRINGE
INTRAMUSCULAR | Status: DC | PRN
  Administered 2019-11-08: 20 mg via INTRAVENOUS

## 2019-11-08 MED ORDER — MENTHOL 3 MG MT LOZG: 1.0000 | LOZENGE | OROMUCOSAL | Status: DC | PRN

## 2019-11-08 MED ORDER — PANTOPRAZOLE SODIUM 40 MG PO TBEC: 40.0000 mg | DELAYED_RELEASE_TABLET | Freq: Every day | ORAL | Status: DC

## 2019-11-08 MED ORDER — MIDAZOLAM HCL 2 MG/2ML IJ SOLN: 0.5000 mg | Freq: Once | INTRAMUSCULAR | Status: DC | PRN

## 2019-11-08 MED ORDER — MEPERIDINE HCL 25 MG/ML IJ SOLN: 6.2500 mg | INTRAMUSCULAR | Status: DC | PRN

## 2019-11-08 MED ORDER — ONDANSETRON HCL 4 MG/2ML IJ SOLN: 4.0000 mg | Freq: Four times a day (QID) | INTRAMUSCULAR | Status: DC | PRN

## 2019-11-08 MED ORDER — MAGNESIUM CITRATE PO SOLN
1.0000 | Freq: Once | ORAL | Status: DC | PRN
  Filled 2019-11-08: qty 296

## 2019-11-08 MED ORDER — PROMETHAZINE HCL 25 MG/ML IJ SOLN: 6.2500 mg | INTRAMUSCULAR | Status: DC | PRN

## 2019-11-08 MED ORDER — GABAPENTIN 300 MG PO CAPS
300.0000 mg | ORAL_CAPSULE | ORAL | Status: AC
  Administered 2019-11-08: 300 mg via ORAL
  Filled 2019-11-08: qty 1

## 2019-11-08 MED ORDER — PRENATAL MULTIVITAMIN CH: 1.0000 | ORAL_TABLET | Freq: Every day | ORAL | Status: DC

## 2019-11-08 MED ORDER — FENTANYL CITRATE (PF) 100 MCG/2ML IJ SOLN: 25.0000 ug | INTRAMUSCULAR | Status: DC | PRN

## 2019-11-08 MED ORDER — KETOROLAC TROMETHAMINE 30 MG/ML IJ SOLN
INTRAMUSCULAR | Status: AC
  Filled 2019-11-08: qty 1

## 2019-11-08 MED ORDER — ALUM & MAG HYDROXIDE-SIMETH 200-200-20 MG/5ML PO SUSP: 30.0000 mL | ORAL | Status: DC | PRN

## 2019-11-08 MED ORDER — METHYLERGONOVINE MALEATE 0.2 MG/ML IJ SOLN
INTRAMUSCULAR | Status: AC
  Filled 2019-11-08: qty 1

## 2019-11-08 MED ORDER — LIDOCAINE-EPINEPHRINE 1 %-1:100000 IJ SOLN
INTRAMUSCULAR | Status: DC | PRN
  Administered 2019-11-08: 20 mL

## 2019-11-08 MED ORDER — OXYCODONE-ACETAMINOPHEN 5-325 MG PO TABS: 2.0000 | ORAL_TABLET | ORAL | Status: DC | PRN

## 2019-11-08 MED ORDER — DEXAMETHASONE SODIUM PHOSPHATE 10 MG/ML IJ SOLN
INTRAMUSCULAR | Status: DC | PRN
  Administered 2019-11-08: 4 mg via INTRAVENOUS

## 2019-11-08 MED ORDER — SODIUM CHLORIDE 0.9% IV SOLUTION: Freq: Once | INTRAVENOUS | Status: DC

## 2019-11-08 MED ORDER — OXYCODONE HCL 5 MG PO TABS: 5.0000 mg | ORAL_TABLET | ORAL | Status: DC | PRN

## 2019-11-08 MED ORDER — 0.9 % SODIUM CHLORIDE (POUR BTL) OPTIME
TOPICAL | Status: DC | PRN
  Administered 2019-11-08: 1000 mL

## 2019-11-08 MED ORDER — MIDAZOLAM HCL 2 MG/2ML IJ SOLN
INTRAMUSCULAR | Status: AC
  Filled 2019-11-08: qty 2

## 2019-11-08 MED ORDER — IBUPROFEN 800 MG PO TABS
800.0000 mg | ORAL_TABLET | Freq: Three times a day (TID) | ORAL | Status: DC
  Administered 2019-11-08 (×2): 800 mg via ORAL
  Filled 2019-11-08 (×2): qty 1

## 2019-11-08 MED ORDER — ACETAMINOPHEN 325 MG PO TABS
650.0000 mg | ORAL_TABLET | Freq: Once | ORAL | Status: AC
  Administered 2019-11-08: 650 mg via ORAL
  Filled 2019-11-08: qty 2

## 2019-11-08 MED ORDER — OXYTOCIN 10 UNIT/ML IJ SOLN
INTRAMUSCULAR | Status: AC
  Filled 2019-11-08: qty 4

## 2019-11-08 MED ORDER — BISACODYL 10 MG RE SUPP: 10.0000 mg | Freq: Every day | RECTAL | Status: DC | PRN

## 2019-11-08 MED ORDER — SODIUM CHLORIDE 0.9 % IV SOLN
3.0000 g | INTRAVENOUS | Status: AC
  Administered 2019-11-08: 3 g via INTRAVENOUS
  Filled 2019-11-08: qty 8

## 2019-11-08 SURGICAL SUPPLY — 27 items
ADAPTER VACURETTE TBG SET 14 (CANNULA) ×3 IMPLANT
CATH ROBINSON RED A/P 16FR (CATHETERS) ×6 IMPLANT
CNTNR URN SCR LID CUP LEK RST (MISCELLANEOUS) ×1 IMPLANT
CONT SPEC 4OZ STRL OR WHT (MISCELLANEOUS) ×2
DECANTER SPIKE VIAL GLASS SM (MISCELLANEOUS) ×6 IMPLANT
DRSG TELFA 3X8 NADH (GAUZE/BANDAGES/DRESSINGS) ×3 IMPLANT
FILTER UTR ASPR ASSEMBLY (MISCELLANEOUS) ×3 IMPLANT
GLOVE BIOGEL PI IND STRL 7.0 (GLOVE) ×4 IMPLANT
GLOVE BIOGEL PI INDICATOR 7.0 (GLOVE) ×8
GLOVE ECLIPSE 7.0 STRL STRAW (GLOVE) ×9 IMPLANT
GOWN STRL REUS W/ TWL LRG LVL3 (GOWN DISPOSABLE) ×4 IMPLANT
GOWN STRL REUS W/TWL LRG LVL3 (GOWN DISPOSABLE) ×8
HOSE CONNECTING 18IN BERKELEY (TUBING) ×3 IMPLANT
KIT BERKELEY 1ST TRI 3/8 NO TR (MISCELLANEOUS) ×3 IMPLANT
KIT BERKELEY 1ST TRIMESTER 3/8 (MISCELLANEOUS) ×6 IMPLANT
NS IRRIG 1000ML POUR BTL (IV SOLUTION) ×3 IMPLANT
PACK VAGINAL MINOR WOMEN LF (CUSTOM PROCEDURE TRAY) ×6 IMPLANT
PAD OB MATERNITY 4.3X12.25 (PERSONAL CARE ITEMS) ×6 IMPLANT
SET BERKELEY SUCTION TUBING (SUCTIONS) ×3 IMPLANT
TOWEL GREEN STERILE FF (TOWEL DISPOSABLE) ×12 IMPLANT
UNDERPAD 30X36 HEAVY ABSORB (UNDERPADS AND DIAPERS) ×6 IMPLANT
VACURETTE 10 RIGID CVD (CANNULA) IMPLANT
VACURETTE 12 RIGID CVD (CANNULA) ×3 IMPLANT
VACURETTE 14MM CVD 1/2 BASE (CANNULA) ×3 IMPLANT
VACURETTE 7MM CVD STRL WRAP (CANNULA) IMPLANT
VACURETTE 8 RIGID CVD (CANNULA) IMPLANT
VACURETTE 9 RIGID CVD (CANNULA) IMPLANT

## 2019-11-08 NOTE — Anesthesia Preprocedure Evaluation (Addendum)
Anesthesia Evaluation  Patient identified by MRN, date of birth, ID band Patient awake    Reviewed: Allergy & Precautions, NPO status , Patient's Chart, lab work & pertinent test results  History of Anesthesia Complications Negative for: history of anesthetic complications  Airway Mallampati: II  TM Distance: >3 FB Neck ROM: Full    Dental  (+) Dental Advisory Given   Pulmonary neg pulmonary ROS,  11/08/2019 SARS coronavirus NEG   breath sounds clear to auscultation       Cardiovascular negative cardio ROS   Rhythm:Regular Rate:Normal     Neuro/Psych negative neurological ROS     GI/Hepatic negative GI ROS, Neg liver ROS,   Endo/Other  diabetes (resolved with delivery), Gestational  Renal/GU negative Renal ROS     Musculoskeletal   Abdominal   Peds  Hematology  (+) Blood dyscrasia (Hb 6.5: receiving pRBCs), anemia ,   Anesthesia Other Findings   Reproductive/Obstetrics (+) Breast feeding  1 week post partum: retained products of conception                            Anesthesia Physical Anesthesia Plan  ASA: II  Anesthesia Plan: General   Post-op Pain Management:    Induction: Intravenous  PONV Risk Score and Plan: 3 and Ondansetron, Dexamethasone, Treatment may vary due to age or medical condition and Scopolamine patch - Pre-op  Airway Management Planned: LMA  Additional Equipment: None  Intra-op Plan:   Post-operative Plan:   Informed Consent: I have reviewed the patients History and Physical, chart, labs and discussed the procedure including the risks, benefits and alternatives for the proposed anesthesia with the patient or authorized representative who has indicated his/her understanding and acceptance.     Dental advisory given  Plan Discussed with: CRNA and Surgeon  Anesthesia Plan Comments: (Discussed with pt through Translation services)       Anesthesia  Quick Evaluation

## 2019-11-08 NOTE — Progress Notes (Signed)
CRITICAL VALUE ALERT  Critical Value:  hgb 6.5  Date & Time Notied:  11/08/19 0349  Provider Notified: Dr Shawnie Pons  Orders Received/Actions taken: no new orders at this time

## 2019-11-08 NOTE — Progress Notes (Signed)
Bladder scan volumes - 136, 349, and 398. Patient has no urge to void.

## 2019-11-08 NOTE — Anesthesia Procedure Notes (Addendum)
Procedure Name: LMA Insertion Date/Time: 11/08/2019 10:23 AM Performed by: Rachel Moulds, CRNA Pre-anesthesia Checklist: Patient identified, Emergency Drugs available, Suction available, Patient being monitored and Timeout performed Patient Re-evaluated:Patient Re-evaluated prior to induction Oxygen Delivery Method: Circle system utilized Preoxygenation: Pre-oxygenation with 100% oxygen Induction Type: IV induction Ventilation: Mask ventilation without difficulty LMA: LMA inserted LMA Size: 4.0 Tube size: 4.0 mm Number of attempts: 1 Placement Confirmation: positive ETCO2,  breath sounds checked- equal and bilateral and CO2 detector Tube secured with: Tape Dental Injury: Teeth and Oropharynx as per pre-operative assessment

## 2019-11-08 NOTE — Discharge Instructions (Signed)
Dilation and Curettage or Vacuum Curettage, Care After This sheet gives you information about how to care for yourself after your procedure. Your health care provider may also give you more specific instructions. If you have problems or questions, contact your health care provider. What can I expect after the procedure? After your procedure, it is common to have:  Mild pain or cramping.  Some vaginal bleeding or spotting. These may last for up to 2 weeks after your procedure. Follow these instructions at home: Activity   Do not drive or use heavy machinery while taking prescription pain medicine.  Avoid driving for the first 24 hours after your procedure.  Take frequent, short walks, followed by rest periods, throughout the day. Ask your health care provider what activities are safe for you. After 1-2 days, you may be able to return to your normal activities.  Do not lift anything heavier than 10 lb (4.5 kg) until your health care provider approves.  For at least 2 weeks, or as long as told by your health care provider, do not: ? Douche. ? Use tampons. ? Have sexual intercourse. General instructions   Take over-the-counter and prescription medicines only as told by your health care provider. This is especially important if you take blood thinning medicine.  Do not take baths, swim, or use a hot tub until your health care provider approves. Take showers instead of baths.  Wear compression stockings as told by your health care provider. These stockings help to prevent blood clots and reduce swelling in your legs.  It is your responsibility to get the results of your procedure. Ask your health care provider, or the department performing the procedure, when your results will be ready.  Keep all follow-up visits as told by your health care provider. This is important. Contact a health care provider if:  You have severe cramps that get worse or that do not get better with  medicine.  You have severe abdominal pain.  You cannot drink fluids without vomiting.  You develop pain in a different area of your pelvis.  You have bad-smelling vaginal discharge.  You have a rash. Get help right away if:  You have vaginal bleeding that soaks more than one sanitary pad in 1 hour, for 2 hours in a row.  You pass large blood clots from your vagina.  You have a fever that is above 100.4F (38.0C).  Your abdomen feels very tender or hard.  You have chest pain.  You have shortness of breath.  You cough up blood.  You feel dizzy or light-headed.  You faint.  You have pain in your neck or shoulder area. This information is not intended to replace advice given to you by your health care provider. Make sure you discuss any questions you have with your health care provider. Document Revised: 05/25/2017 Document Reviewed: 01/13/2016 Elsevier Patient Education  2020 [employer] Inc[employer]  

## 2019-11-08 NOTE — Op Note (Signed)
Kristi [name] PROCEDURE DATE:  11/08/2019  PREOPERATIVE DIAGNOSIS: Retained products of conception with associated hemorrhage s/p cesarean section one week ago POSTOPERATIVE DIAGNOSIS: The same PROCEDURE:  Postpartum Dilation and Evacuation under ultrasound guidance SURGEON:  Dr. Jaynie Collins  INDICATIONS: 29 y.o. [id number] with retained placenta and hemorrhage after SVD at Unknown, needing emergent surgical completion.  Risks of surgery were discussed with the patient and her husband including but not limited to: bleeding which may require transfusion; infection which may require antibiotics; injury to uterus or surrounding organs; need for additional procedures including laparotomy or laparoscopy; possibility of intrauterine scarring which may impair future fertility; risk of retained products which may require further management  and other postoperative/anesthesia complications. Written informed consent was obtained.  FINDINGS: Small amount of products of conception/placental fragments with significant clots and blood, about 800 ml in the very distended postpartum uterus. Intact repaired hysterotomy on ultrasound.   Intraoperative EBL was 200 ml; administered Methergine and Misoprostol to help with uterotonicity.  ANESTHESIA:  General INTRAVENOUS FLUIDS:  4 pRBCs + 1600 ml of LR ESTIMATED BLOOD LOSS:  1000 ml  SPECIMENS:  Products of conception and clots sent to pathology COMPLICATIONS:  None immediate.  PROCEDURE DETAILS:  The patient was then taken to the operating room where general anesthesia was administered and was found to be adequate.  After an adequate timeout was performed, she was placed in the dorsal lithotomy position and examined; then prepped and draped in the sterile manner.   A vaginal speculum was then placed in the patient's vagina and a tenaculum was applied to the anterior lip of the cervix.  The cervix was already about 1 cm dilated.  A 14 mm suction curette was then advanced  into the uterus under ultrasound guidance, the suction device was then activated and curette slowly rotated to clear further placental fragments. This was repeated several times as there was at least about 800 ml of mostly old blood clots and a little tissue evacuated from the uterine cavity.  When the uterus was seen to be empty on ultrasound, a gentle sharp curettage was then performed to confirm gritty nature of interior of the uterus. There was significant bleeding during the procedure, so IM Methergine and PR Misoprostol 800 mcg was administered.   The bleeding was noted to subside significantly.  All instruments were removed from the patient's vagina.  Sponge and instrument counts were correct times three.  The patient tolerated the procedure well and was taken to the recovery area extubated, awake and in stable condition.  Given the amount of blood loss, patient will be observed in the hospital for a few hours. If stable, can consider discharge later today or tomorrow morning.    Jaynie Collins, MD, FACOG Obstetrician & Gynecologist, [facility] for [employer], [facility]

## 2019-11-08 NOTE — Interval H&P Note (Signed)
History and Physical Interval Note 11/08/2019 9:19 AM  Kristi [name]  has presented today for surgery with the diagnosis of retained products and hemorrhage after recent cesarean section. Using the help of the video [place] interpreter, discussed the scheduled DILATION AND EVACUATION procedure with patient.  Emphasized that I will use ultrasound guidance given the amount of tissue in the uterus and recent cesarean section.  Risks of surgery including bleeding, infection, injury to surrounding organs, need for additional procedures, possibility of intrauterine scarring which may impair future fertility, risk of retained products which may require further management and other postoperative/anesthesia complications were explained to patient.  Likelihood of success of complete evacuation of the uterus was discussed with the patient.  Questions were answered to the patient's satisfaction.  Written informed consent was obtained.    The patient's history has been reviewed, patient examined, no change in status, stable for surgery.  I have reviewed the patient's chart and labs.  Patient has been NPO since last night  she will remain NPO for procedure. Anesthesia and OR aware.  Preoperative prophylactic Doxycycline 200mg  IV has been ordered and is on call to the OR. Transfusion of pRBCs is ongoing, will get remaining units in OR/PACU. To OR when ready.      , MD, FACOG Obstetrician & Gynecologist, [facility] for [facility]

## 2019-11-08 NOTE — H&P (Signed)
Kristi [name] is an 29 y.o. [id number] female.   Chief Complaint: heavy vaginal bleeding HPI: Patient is a [id number] who underwent RCS on 10/31/2019 who returned yesterday wit bleeding. Hgb was 10.2 then. U/S showed 8 cm retained POC in uterus. Given Methergine, but pharmacy did not have this. Returned with Hgb of 7.2. Admitted overnight. They have changed one pad overnight, so minimal bleeding, but Hgb this am is 6.5. She has been dizzy and light-headed. Notes chills. No fever. Sh is having abdominal pain.  Past Medical History:  Diagnosis Date  . Gestational diabetes   . Kidney stones 2015    Past Surgical History:  Procedure Laterality Date  . CESAREAN SECTION N/A 11/08/2015   Procedure: CESAREAN SECTION;  Surgeon: Florian Buff, MD;  Location: [place];  Service: Obstetrics;  Laterality: N/A;  . CESAREAN SECTION N/A 10/31/2019   Procedure: CESAREAN SECTION;  Surgeon: Woodroe Mode, MD;  Location: [facility] LD [facility]S;  Service: Obstetrics;  Laterality: N/A;    Family History  Problem Relation Age of Onset  . Heart disease Mother   . Diabetes Mother   . Cancer Mother        stomach  . Hypertension Mother   . Hypertension Father   . Diabetes Father    Social History:  reports that she has never smoked. She has never used smokeless tobacco. She reports that she does not drink alcohol or use drugs.  Allergies: No Known Allergies  Medications Prior to Admission  Medication Sig Dispense Refill  . oxyCODONE (ROXICODONE) 5 MG immediate release tablet Take 1 tablet (5 mg total) by mouth every 6 (six) hours as needed for up to 3 days for breakthrough pain. 12 tablet 0  . ferrous sulfate 325 (65 FE) MG tablet Take 1 tablet (325 mg total) by mouth daily with breakfast. 30 tablet 3  . ibuprofen (ADVIL) 800 MG tablet Take 1 tablet (800 mg total) by mouth every 6 (six) hours. 30 tablet 0  . misoprostol (CYTOTEC) 200 MCG tablet Take 4 tablets (800 mcg total) by mouth daily for 2 days. 8 tablet 0  .  Prenatal Vit-Fe Fumarate-FA (PRENATAL MULTIVITAMIN) TABS tablet Take 1 tablet by mouth daily.       Pertinent items are noted in HPI.  Blood pressure 98/60, pulse 90, temperature 98.1 F (36.7 C), temperature source Oral, resp. rate 16, height 5\' 2"  (1.575 m), weight 72.8 kg, SpO2 99 %, unknown if currently breastfeeding. BP 98/60 (BP Location: Right Arm)   Pulse 90   Temp 98.1 F (36.7 C) (Oral)   Resp 16   Ht 5\' 2"  (1.575 m)   Wt 72.8 kg   SpO2 99%   BMI 29.35 kg/m  General appearance: alert, cooperative and appears stated age She is pale Head: Normocephalic, without obvious abnormality, atraumatic Neck: supple, symmetrical, trachea midline Lungs: normal effort Heart: regular rate and rhythm Abdomen: soft, fundus is firm and diffusely tender Extremities: Homans sign is negative, no sign of DVT Skin: Skin color, texture, turgor normal. No rashes or lesions Neurologic: Grossly normal   Lab Results  Component Value Date   WBC 9.6 11/08/2019   HGB 6.5 (LL) 11/08/2019   HCT 21.3 (L) 11/08/2019   MCV 70.8 (L) 11/08/2019   PLT 127 (L) 11/08/2019   Lab Results  Component Value Date   PREGTESTUR NEGATIVE 12/22/2015     Assessment/Plan Active Problems:   Retained products of conception, postpartum   Acute blood loss anemia Will transfuse  2 u RBCs and give Antibiotics. Move to D and C. OR called. She has been NPO. She is nursing.  Risks include but are not limited to bleeding, infection, injury to surrounding structures, including bowel, bladder and ureters, blood clots, and death.  Likelihood of success is high.    Reva Bores 11/08/2019, 6:47 AM

## 2019-11-08 NOTE — Progress Notes (Signed)
   11/08/19 2237  AVS Discharge Documentation  AVS Discharge Instructions Including Medications Provided to patient/caregiver  Name of Person Receiving AVS Discharge Instructions Including Medications Kristi [name]  Name of Clinician That Reviewed AVS Discharge Instructions Including Medications Theresa Mulligan RN   Pt given AVS instructions and all questions answered. Pain assessed and all belongings returned to pt. Pt ambulated with SO off unit.

## 2019-11-08 NOTE — Anesthesia Postprocedure Evaluation (Signed)
Anesthesia Post Note  Patient: Kristi [name]  Procedure(s) Performed: DILATATION AND EVACUATION (N/A )     Patient location during evaluation: PACU Anesthesia Type: General Level of consciousness: awake and alert, oriented and patient cooperative Pain management: pain level controlled Vital Signs Assessment: post-procedure vital signs reviewed and stable Respiratory status: spontaneous breathing, nonlabored ventilation and respiratory function stable Cardiovascular status: blood pressure returned to baseline and stable Postop Assessment: no apparent nausea or vomiting Anesthetic complications: no Comments: Pt understands to discard of breast milk while she is consuming narcotic pain medication    Last Vitals:  Vitals:   11/08/19 1205 11/08/19 1220  BP: 109/66 101/66  Pulse: 68 77  Resp: 16 16  Temp:    SpO2: 100% 98%    Last Pain:  Vitals:   11/08/19 1220  TempSrc:   PainSc: 0-No pain                 Analisse Randle,E. Rahsaan Weakland

## 2019-11-08 NOTE — Transfer of Care (Signed)
Immediate Anesthesia Transfer of Care Note  Patient: Kristi [name]  Procedure(s) Performed: DILATATION AND EVACUATION (N/A )  Patient Location: PACU  Anesthesia Type:General  Level of Consciousness: awake and alert   Airway & Oxygen Therapy: Patient Spontanous Breathing and Patient connected to nasal cannula oxygen  Post-op Assessment: Report given to RN and Post -op Vital signs reviewed and stable  Post vital signs: Reviewed and stable  Last Vitals:  Vitals Value Taken Time  BP    Temp    Pulse 72 11/08/19 1152  Resp 16 11/08/19 1152  SpO2 100 % 11/08/19 1152  Vitals shown include unvalidated device data.  Last Pain:  Vitals:   11/08/19 1014  TempSrc: Oral  PainSc:       Patients Stated Pain Goal: 2 (11/08/19 0325)  Complications: No apparent anesthesia complications

## 2019-11-09 LAB — TYPE AND SCREEN
ABO/RH(D): O POS
Antibody Screen: NEGATIVE
Unit division: 0
Unit division: 0
Unit division: 0
Unit division: 0

## 2019-11-09 LAB — BPAM RBC
Blood Product Expiration Date: 202106122359
Blood Product Expiration Date: 202106122359
Blood Product Expiration Date: 202106142359
Blood Product Expiration Date: 202106152359
ISSUE DATE / TIME: 202105150811
ISSUE DATE / TIME: 202105150933
ISSUE DATE / TIME: 202105151108
ISSUE DATE / TIME: 202105151108
Unit Type and Rh: 5100
Unit Type and Rh: 5100
Unit Type and Rh: 5100
Unit Type and Rh: 5100

## 2019-11-09 NOTE — Discharge Summary (Signed)
Physician Discharge Summary  Patient ID: Kristi [name] MRN: [id number] DOB/AGE: [birth year] 29 y.o.  Admit date: 11/07/2019 Discharge date: 11/08/2019  Admission Diagnoses:  Retained products of conception after recent cesarean section and bleeding Symptomatic acute blood loss anemia  Discharge Diagnoses:  Active Problems:   Previous cesarean delivery, delivered   Postpartum care following cesarean delivery   Acute blood loss anemia   Retained products of conception after delivery with complications  Procedures: Dilation and Evacuation under ultrasound guidance  Discharged Condition: Stable  Hospital Course: Patient was admitted with symptomatic anemia secondary to hemorrhage and known retained products of conception, requiring surgical management.  She was transfused with a total of 4 units o pRBCs and underwent an uncomplicated Dilation and Evacuation procedure under ultrasoun guidance. For further details, please refer to operative note. She was stable after surgery and was observed for a few hours. Pain was well controlled, she was voiding and OOB without difficulty. She desired to go home to her family, and she was deemed stable for discharge to home.   Significant Diagnostic Studies: CBC Latest Ref Rng & Units 11/08/2019 11/08/2019 11/08/2019  WBC 4.0 - 10.5 K/uL 11.2(H) 9.6 10.3  Hemoglobin 12.0 - 15.0 g/dL 10.4(L) 6.5(LL) 7.3(L)  Hematocrit 36.0 - 46.0 % 31.6(L) 21.3(L) 23.6(L)  Platelets 150 - 400 K/uL 113(L) 127(L) 140(L)   CMP Latest Ref Rng & Units 11/01/2019  Creatinine 0.44 - 1.00 mg/dL 6.64   US PELVIS (TRANSABDOMINAL ONLY)  Result Date: 11/07/2019 CLINICAL DATA:  Postpartum bleeding and cramping since yesterday, post Caesarean section 10/31/2019 EXAM: TRANSABDOMINAL ULTRASOUND OF PELVIS TECHNIQUE: Transabdominal ultrasound examination of the pelvis was performed including evaluation of the uterus, ovaries, adnexal regions, and pelvic cul-de-sac. Transvaginal imaging not  performed. COMPARISON:  None FINDINGS: Uterus Measurements: 16.1 x 10.8 x 12.2 cm = volume: 1102 mL. Which enlarged postpartum uterus at. Anteverted. No focal uterine mass. Endometrium Thickness: Markedly thickened endometrial canal up to 80 mm diameter. Heterogeneous material distends the endometrial canal, with no internal blood flow on color Doppler imaging, likely representing hemorrhage/clot though avascular retained products cannot be excluded. No focal echogenic foci or simple fluid identified. Right ovary Measurements: 2.7 x 1.7 x 3.2 cm = volume: 7.6 mL. Normal morphology without mass Left ovary Measurements: 2.0 x 3.2 x 1.7 cm = volume: 5.7 mL. Normal morphology without mass Other findings:  Trace free pelvic fluid.  No adnexal masses. IMPRESSION: Markedly thickened endometrial canal 80 mm diameter containing extensive heterogeneous avascular internal echogenicity, favor hemorrhage/clot though unable to completely exclude avascular retained products of conception with this appearance. Unremarkable ovaries and adnexa. Electronically Signed   By: Ulyses Southward M.D.   On: 11/07/2019 11:00   [place] Intraoperative  Result Date: 11/08/2019 CLINICAL DATA:  Ultrasound was provided for use by the ordering physician, and a technical charge was applied by the performing facility.  No radiologist interpretation/professional services rendered.    Discharge Exam: Blood pressure 96/61, pulse 76, temperature 97.8 F (36.6 C), temperature source Oral, resp. rate 18, height 5\' 2"  (1.575 m), weight 72.8 kg, SpO2 99 %, unknown if currently breastfeeding. General appearance: alert and no distress Resp: clear to auscultation bilaterally Cardio: regular rate and rhythm GI: soft, non-tender; bowel sounds normal; no masses,  no organomegaly Pelvic: minimal bleeding noted Extremities: extremities normal, atraumatic, no cyanosis or edema and Homans sign is negative, no sign of DVT Pulses: 2+ and symmetric Skin: Skin  color, texture, turgor normal. No rashes or lesions Neurologic: Alert and oriented  X 3, normal strength and tone. Normal symmetric reflexes. Normal coordination and gait  Disposition: Discharge disposition: 01-Home or Self Care      Future Appointments  Date Time Provider [place]  11/14/2019  8:30 AM [facility] NURSE [facility]  12/01/2019  8:15 AM Virginia Rochester, NP [facility]     Discharge Instructions    Discharge patient   Complete by: As directed    Discharge disposition: 01-Home or Self Care   Discharge patient date: 11/08/2019     Allergies as of 11/08/2019   No Known Allergies     Medication List    STOP taking these medications   ferrous sulfate 325 (65 FE) MG tablet   misoprostol 200 MCG tablet Commonly known as: Cytotec   prenatal multivitamin Tabs tablet     TAKE these medications   ibuprofen 800 MG tablet Commonly known as: ADVIL Take 1 tablet (800 mg total) by mouth every 6 (six) hours.   oxyCODONE 5 MG immediate release tablet Commonly known as: Roxicodone Take 1 tablet (5 mg total) by mouth every 6 (six) hours as needed for up to 3 days for breakthrough pain.        Signed: Verita Schneiders, MD, [place] for [employer], [place]

## 2019-11-11 LAB — SURGICAL PATHOLOGY

## 2019-11-14 ENCOUNTER — Encounter: Payer: Self-pay | Admitting: *Deleted

## 2019-11-14 ENCOUNTER — Ambulatory Visit (INDEPENDENT_AMBULATORY_CARE_PROVIDER_SITE_OTHER): Payer: BC Managed Care – PPO | Admitting: *Deleted

## 2019-11-14 ENCOUNTER — Other Ambulatory Visit: Payer: Self-pay

## 2019-11-14 VITALS — BP 109/77 | HR 93 | Temp 98.0°F | Wt 151.4 lb

## 2019-11-14 DIAGNOSIS — Z4889 Encounter for other specified surgical aftercare: Secondary | ICD-10-CM

## 2019-11-14 NOTE — Progress Notes (Signed)
Chart reviewed for nurse visit. Agree with plan of care.   Marny Lowenstein, PA-C 11/14/2019 11:56 AM

## 2019-11-14 NOTE — Progress Notes (Signed)
[id number]  Video interpreter Lucille Passy [phone] used for encounter. Pt seen for wound check d/t C/S on 5/7. She reports having no pain. C/S incision assessed and was found to be well healed with no redness, swelling, drainage or open areas. She was informed of proper daily hygiene for the wound. Pt was given BP cuff which had been sent to our location by [facility] however there is no indication for her to check BP on a regular basis @ this time. Pt voiced understanding of all information and instructions given.

## 2019-12-01 ENCOUNTER — Ambulatory Visit: Payer: Medicaid Other | Admitting: Nurse Practitioner

## 2020-05-03 ENCOUNTER — Ambulatory Visit (INDEPENDENT_AMBULATORY_CARE_PROVIDER_SITE_OTHER): Payer: BC Managed Care – PPO | Admitting: Obstetrics & Gynecology

## 2020-05-03 ENCOUNTER — Other Ambulatory Visit (HOSPITAL_COMMUNITY)
Admission: RE | Admit: 2020-05-03 | Discharge: 2020-05-03 | Disposition: A | Discharge status: Home / Self Care | Payer: BC Managed Care – PPO | Source: Ambulatory Visit | Attending: Obstetrics & Gynecology | Admitting: Obstetrics & Gynecology

## 2020-05-03 ENCOUNTER — Other Ambulatory Visit: Payer: Self-pay

## 2020-05-03 ENCOUNTER — Encounter: Payer: Self-pay | Admitting: Obstetrics & Gynecology

## 2020-05-03 VITALS — BP 124/79 | HR 93 | Wt 149.0 lb

## 2020-05-03 DIAGNOSIS — N946 Dysmenorrhea, unspecified: Secondary | ICD-10-CM

## 2020-05-03 MED ORDER — NAPROXEN 500 MG PO TABS: 500.0000 mg | ORAL_TABLET | Freq: Two times a day (BID) | ORAL | 2 refills | Status: AC

## 2020-05-03 NOTE — Progress Notes (Signed)
Constricting pain with cycle

## 2020-05-03 NOTE — Patient Instructions (Signed)
?au b?ng kinh Dysmenorrhea  ?au b?ng kinh ?? c?p ??n nh?ng c?n co th?t do c? t? cung th?t l?i (co rt) trong k? kinh gy ra. ?au b?ng kinh c th? nh?, ho?c ?? n?ng ?? ?nh h??ng ??n sinh ho?t hng ngy trong vi ngy m?i thng. ?au b?ng kinh nguyn pht l co th?t trong k? kinh nguy?t ko di vi ngy khi qu v? b?t ??u c kinh nguy?t ho?c ngay sau ?Marland Kitchen Tnh tr?ng ny th??ng b?t ??u sau khi m?t b gi b?t ??u c kinh nguy?t. Khi ng??i ph? n? l?n h?n ho?c c m?t con, cc c?n co th?t th??ng s? gi?m b?t ho?c bi?n m?t. ?au b?ng kinh th? pht b?t ??u mu?n h?n trong cu?c ??i v do r?i lo?n h? sinh s?n gy ra. N ko di h?n v n c th? gy ?au h?n so v?i ?au b?ng kinh nguyn pht. C?n ?au c th? b?t ??u tr??c k? kinh nguy?t v m?t vi ngy sau kinh nguy?t. Nguyn nhn g gy ra? ?au b?ng kinh th??ng do m?t v?n ?? bn trong gy ra, ch?ng h?n nh?:  M lt t? cung (n?i m?c t? cung) pht tri?n bn ngoi t? cung ? cc khu v?c khc c?a c? th? (l?c n?i m?c t? cung).  M n?i m?c t? cung pht tri?n vo cc thnh c? c?a t? cung (l?c n?i m?c trong c? t? cung).  Cc m?ch mu trong khung ch?u tr? ln ??y mu ngay tr??c k? kinh (h?i ch?ng xung huy?t vng x??ng ch?u).  Pht tri?n qu m?c cc t? bo (polyp) trong n?i m?c t? cung ho?c ? ph?n d??i c?a t? cung (c? t? cung).  T? cung r?i xu?ng m ??o (sa) do c? b? y?u ho?c ko gin.  V?n ?? v? bng quang, ch?ng h?n nh? nhi?m trng ho?c vim.  Cc v?n ?? v? ru?t, ch?ng h?n nh? kh?i u ho?c h?i ch?ng ru?t kch thch.  Ung th? bng quang ho?c c? quan sinh s?n.  T? cung ng? v? sau qu m?c.  C? t? cung ?ng ho?c c l? m? r?t nh?.  Kh?i u khng ph?i ung th? (lnh tnh) c?a t? cung (u x?).  B?nh vim vng ch?u (PID).  S?o vng ch?u (dnh) t? ph?u thu?t tr??c.  U nang bu?ng tr?ng.  IUD (vng trnh [place] trong t? cung). ?i?u g lm t?ng nguy c?? Qu v? d? b? tnh tr?ng ny h?n n?u:  Qu v? d??i 30 tu?i.  Qu v? b?t ??u d?y th s?m.  Qu v? b? ch?y mu khng  ??u ho?c nhi?u.  Qu v? ch?a bao gi? sinh con.  Qu v? c ti?n s? gia ?nh ?au b?ng kinh.  Qu v? ht thu?c l. Cc d?u hi?u ho?c tri?u ch?ng l g? Nh?ng tri?u ch?ng c?a tnh tr?ng ny bao g?m:  Co th?t, ?au nhi, ho?c c?m th?y ??y b?ng d??i.  ?au vng th?t l?ng.  Kinh nguy?t ko di h?n 7 ngy.  ?au ??u.  ??y h?i.  M?t m?i.  Bu?n nn ho?c nn.  Tiu ch?y.  V m? hi ho?c chng m?t.  Phn l?ng. Ch?n ?on tnh tr?ng ny nh? th? no? Tnh tr?ng ny c th? ???c ch?n ?on d?a vo:  Tri?u ch?ng c?a qu v?.  B?nh s? c?a qu v?.  Khm th?c th?.  Xt nghi?m mu.  Xt nghi?m [place]. ?y l xt nghi?m trong ? cc t? bo t? c? t? cung ???c xt nghi?m ?? tm d?u hi?u ung th?  ho?c nhi?m trng.  Xt nghi?m th? [place].  Ca?c ki?m tra hnh ?nh, nh?: ? Siu m. ? Th? thu?t ?? lo?i b? v ki?m tra m?u m n?i m?c t? cung (nong v n?o, D&C). ? Th? thu?t ?? ki?m tra b?ng m?t th??ng ph?n bn trong c?a:  T? cung (soi t? cung).  B?ng ho?c khung ch?u (soi ? b?ng).  Bng quang (n?i soi bng quang).  Ru?t (n?i soi ??i trng).  D? dy (Soi d? dy). ? Ch?p X quang. ? Ch?p CT. ? Ch?p MRI. Tnh tr?ng ny ???c ?i?u tr? nh? th? no? ?i?u tr? ph? thu?c vo nguyn nhn gy ?au b?ng kinh. ?i?u tr? c th? bao g?m:  Thu?c gi?m ?au do chuyn gia ch?m [place] s?c kh?e k ??n.  Vin thu?c trnh [place] ch?a hc mn progesterone.  IUD ch?a hc mn progesterone.  Thu?c ki?m sot ch?y mu.  Li?u php thay th? hc mn.  Thu?c ch?ng vim khng steroid (NSAID). Nh?ng lo?i thu?c ny c th? gip ng?n s?n xu?t hc mn gy co th?t.  Thu?c ch?ng tr?m c?m.  Ph?u thu?t ?? lo?i b? cc ch? dnh, l?c n?i m?c t? cung, u nang bu?ng tr?ng, u x?, ho?c ton b? t? cung (c?t b? t? cung).  Tim progesterone ?? d?ng k? kinh.  Th? thu?t ?? ph h?y n?i m?c t? cung (c?t b? n?i m?c t? cung).  Th? thu?t c?t dy th?n kinh ? ?y c?t s?ng (x??ng cng) ?i ??n c? quan sinh s?n (c?t th?n kinh tr??c x??ng cng).  Thu?t  thu?t g?n dng ?i?n vo dy th?n kinh trn x??ng cng (kch thch dy th?n kinh x??ng cng).  T?p th? d?c v v?t l tr? li?u.  Thi?n v yoga tr? li?u.  Chm c?u. Lm vi?c v?i chuyn gia ch?m [place] s?c kh?e ?? xc ??nh bi?n php ?i?u tr? ho?c ph?i h?p cc bi?n php ?i?u tr? no l t?t nh?t cho qu v?. Tun th? nh?ng h??ng d?n ny ? nh: Gi?m ?au v gi?m co th?t  Ch??m nng vo th?t l?ng ho?c b?ng d??i c?a qu v? khi qu v? b? ?au ho?c co th?t. S? d?ng ngu?n nhi?t m chuyn gia ch?m [place] s?c kh?e khuy?n ngh?, ch?ng h?n nh? ti ch??m nhi?t ?m ho?c ??m ch??m nng. ? ?? kh?n t?m ? gi?a da v ngu?n nhi?t. ? Duy tr ngu?n nhi?t trong 20-30 pht. ? B? ngu?n nhi?t ra n?u da qu v? chuy?n sang mu ?? nh?t. ?i?u ny ??c bi?t quan tr?ng n?u qu v? khng th? c?m th?y ?au, nng, hay l?nh. Qu v? c th? c nguy c? b? b?ng cao h?n. ? Khng ng? khi ?ang dng mi?ng ??m ch??m nng.  T?p cc bi th? d?c nh?p ?i?u, nh? l ?i b?, b?i, ho?c ??p xe. T?p nh? v?y c th? gip lm gi?m co th?t.  Mt-xa vng th?t l?ng ho?c b?ng d??i ?? gip gi?m ?au. H??ng d?n chung  Ch? s? d?ng thu?c khng k ??n v thu?c k ??n theo ch? d?n c?a chuyn gia ch?m [place] s?c kh?e.  Khng li xe ho?c v?n hnh my mc h?ng n?ng trong khi dng thu?c gi?m ?au ???c k ??n.  [place] s? d?ng r??u v caffeine trong v ngay tr??c k? kinh c?a qu v?. Nh?ng ch?t ny c th? lm tnh tr?ng co th?t tr?m tr?ng h?n.  Khng s? d?ng b?t k? s?n ph?m no ch?a nicotine ho?c thu?c l, ch?ng ha?n nh? thu?c l d?ng ht v thu?c l ?i?n t?.  N?u qu v? c?n gip ?? ?? cai thu?c, hy h?i chuyn gia ch?m [place] s?c kh?e.  Tun th? t?t c? cc l?n khm theo di theo ch? d?n c?a chuyn gia ch?m [place] s?c kh?e. ?i?u ny c vai tr quan tr?ng. Hy lin l?c v?i chuyn gia ch?m [place] s?c kh?e n?u:  N?u quy? vi? bi? ?au tr?m tr?ng h?n ho??c khng ??? sau khi du?ng thu?c.  Qu v? ?au khi quan h? tnh d?c.  Qu v? b? bu?n nn ho?c nn m?a khi c kinh nguy?t m khng ki?m sot  ???c b?ng thu?c. Yu c?u tr? gip ngay l?p t?c n?u:  Qu v? b? ng?t. Tm t?t  ?au b?ng kinh ?? c?p ??n nh?ng c?n co th?t do c? t? cung th?t l?i (co rt) trong k? kinh gy ra.  ?au b?ng kinh c th? nh?, ho?c ?? n?ng ?? ?nh h??ng ??n sinh ho?t hng ngy trong vi ngy m?i thng.  ?i?u tr? ph? thu?c vo nguyn nhn gy ?au b?ng kinh.  Lm vi?c v?i chuyn gia ch?m [place] s?c kh?e ?? xc ??nh bi?n php ?i?u tr? ho?c ph?i h?p cc bi?n php ?i?u tr? no l t?t nh?t cho qu v?. Thng tin ny khng nh?m m?c ?ch thay th? cho l?i khuyn m chuyn gia ch?m [place] s?c kh?e ni v?i qu v?. Hy b?o ??m qu v? ph?i th?o lu?n b?t k? v?n ?? g m qu v? c v?i chuyn gia ch?m  s?c kh?e c?a qu v?. Document Revised: 02/13/2017 Document Reviewed: 10/19/2016 Elsevier Patient Education  2020 [employer].

## 2020-05-03 NOTE — Progress Notes (Signed)
GYNECOLOGY OFFICE VISIT NOTE  History:   Kristi [name] is a 29 y.o. [id number] here today for evaluation of pain with menstrual periods.  Patient is Vietnamese-speaking only, interpreter present for this encounter. Hitory of cesarean section on 10/31/2019 complicated by postpartum hemorrhage, retained products of conception s/p Dilation and Evacuation on 11/08/2019.  She received 4 units pRBCs for symptomatic anemia during that hospitalization. Since then, she reports painful regular menstrual periods.  The first month was very severe, feels like her muscles are contracting and pushing out blood.  The subsequent months were slightly better.  Pain is usually more the first few days. Was alleviated somewhat with Ibuprofen.  She denies any abnormal vaginal discharge, bleeding, pelvic pain, pain with intercourse or other concerns.    Past Medical History:  Diagnosis Date  . Gestational diabetes   . Kidney stones 2015    Past Surgical History:  Procedure Laterality Date  . CESAREAN SECTION N/A 11/08/2015   Procedure: CESAREAN SECTION;  Surgeon: Lazaro Arms, MD;  Location: [facility];  Service: Obstetrics;  Laterality: N/A;  . CESAREAN SECTION N/A 10/31/2019   Procedure: CESAREAN SECTION;  Surgeon: Adam Phenix, MD;  Location: [facility] LD ORS;  Service: Obstetrics;  Laterality: N/A;  . DILATION AND EVACUATION N/A 11/08/2019   Procedure: DILATATION AND EVACUATION;  Surgeon: Tereso Newcomer, MD;  Location: [facility] OR;  Service: Gynecology;  Laterality: N/A;    The following portions of the patient's history were reviewed and updated as appropriate: allergies, current medications, past family history, past medical history, past social history, past surgical history and problem list.   Health Maintenance:  Normal pap in 2016.   Review of Systems:  Pertinent items noted in HPI and remainder of comprehensive ROS otherwise negative.  Physical Exam:  BP 124/79   Pulse 93   Wt 149 lb (67.6 kg)   BMI  27.25 kg/m  CONSTITUTIONAL: Well-developed, well-nourished female in no acute distress.  HEENT:  Normocephalic, atraumatic. External right and left ear normal. No scleral icterus.  NECK: Normal range of motion, supple, no masses noted on observation SKIN: No rash noted. Not diaphoretic. No erythema. No pallor. MUSCULOSKELETAL: Normal range of motion. No edema noted. NEUROLOGIC: Alert and oriented to person, place, and time. Normal muscle tone coordination. No cranial nerve deficit noted. PSYCHIATRIC: Normal mood and affect. Normal behavior. Normal judgment and thought content. CARDIOVASCULAR: Normal heart rate noted RESPIRATORY: Effort and breath sounds normal, no problems with respiration noted ABDOMEN: No masses noted. No other overt distention noted.   PELVIC: Normal appearing external genitalia; normal urethral meatus; normal appearing vaginal mucosa and cervix.  No abnormal discharge noted. Pap smear done.  Normal uterine size, no other palpable masses, no uterine or adnexal tenderness. Performed in the presence of a chaperone     Assessment and Plan:      1. Dysmenorrhea Benign exam.  Will follow up pap (with ancillary testing) and ultrasound.  Naproxen given as needed for pain.  If pain continues and workup is negative, may need to consider other evaluation or hormonal therapy (currently on condoms for contraception).   - US PELVIC COMPLETE WITH TRANSVAGINAL; Future - naproxen (NAPROSYN) 500 MG tablet; Take 1 tablet (500 mg total) by mouth 2 (two) times daily with a meal. As needed for pain  Dispense: 60 tablet; Refill: 2 - Cytology - PAP Routine preventative health maintenance measures emphasized. Please refer to After Visit Summary for other counseling recommendations.   Return for any  gynecologic concerns.    I spent 25 minutes dedicated to the care of this patient including pre-visit review of records, face to face time with the patient discussing her conditions and treatments  and post visit ordering of testing.    Jaynie Collins, MD, FACOG Obstetrician & Gynecologist, [facility] for [employer], [facility]

## 2020-05-05 LAB — CYTOLOGY - PAP
Chlamydia: NEGATIVE
Comment: NEGATIVE
Comment: NEGATIVE
Comment: NEGATIVE
Comment: NORMAL
Diagnosis: NEGATIVE
High risk HPV: NEGATIVE
Neisseria Gonorrhea: NEGATIVE
Trichomonas: NEGATIVE

## 2020-05-17 ENCOUNTER — Ambulatory Visit: Admission: RE | Admit: 2020-05-17 | Payer: BC Managed Care – PPO | Source: Ambulatory Visit

## 2020-06-08 ENCOUNTER — Other Ambulatory Visit: Payer: Self-pay

## 2020-06-08 ENCOUNTER — Ambulatory Visit
Admission: RE | Admit: 2020-06-08 | Discharge: 2020-06-08 | Disposition: A | Discharge status: Home / Self Care | Payer: BC Managed Care – PPO | Source: Ambulatory Visit | Attending: Obstetrics & Gynecology | Admitting: Obstetrics & Gynecology

## 2020-06-08 DIAGNOSIS — N946 Dysmenorrhea, unspecified: Secondary | ICD-10-CM | POA: Insufficient documentation

## 2020-06-09 ENCOUNTER — Telehealth: Payer: Self-pay

## 2020-06-09 NOTE — Telephone Encounter (Addendum)
-----   Message from Tereso Newcomer, MD sent at 06/09/2020  8:53 AM EST ----- Normal pelvic ultrasound. Please call to inform patient of results. [place] speaking.  Called pt with [facility] interpreter Juel Burrow ID [phone]. VM left stating office is calling with results. Callback number given and requested pt return phone call.

## 2020-06-09 NOTE — Telephone Encounter (Signed)
Opened in error

## 2020-06-10 NOTE — Telephone Encounter (Signed)
Called patient with pac[place] interpreter [phone] and informed her of normal results. Patient verbalized understanding & had no questions.

## 2020-09-30 IMAGING — US US MFM FETAL BPP W/O NON-STRESS
1 series · 15 of 18 positions shown · non-contrast
Comparison: none

[Series 1: us mfm fetal bpp w/o non-stress · 18 acquisitions, 15 frames shown]
[im 1/18]
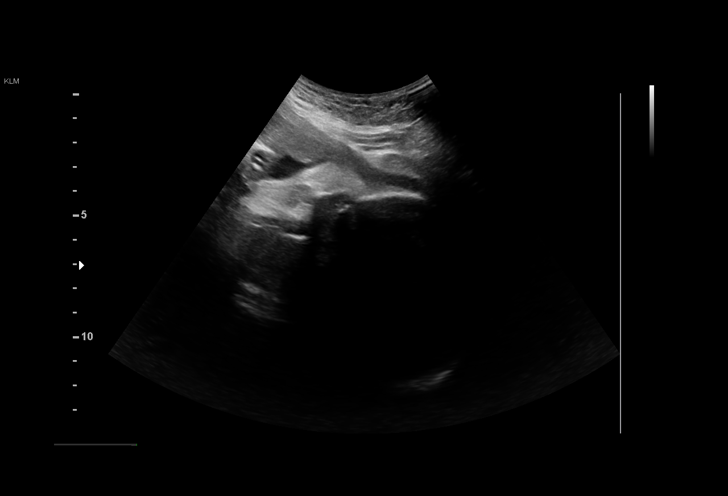
[im 2/18]
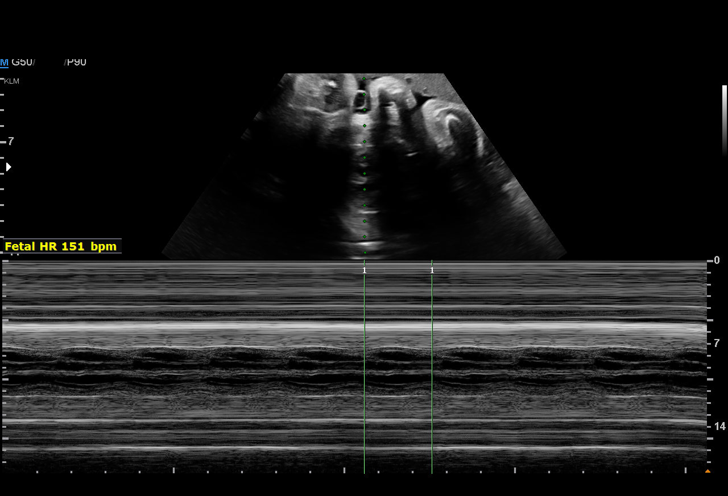
[im 4/18]
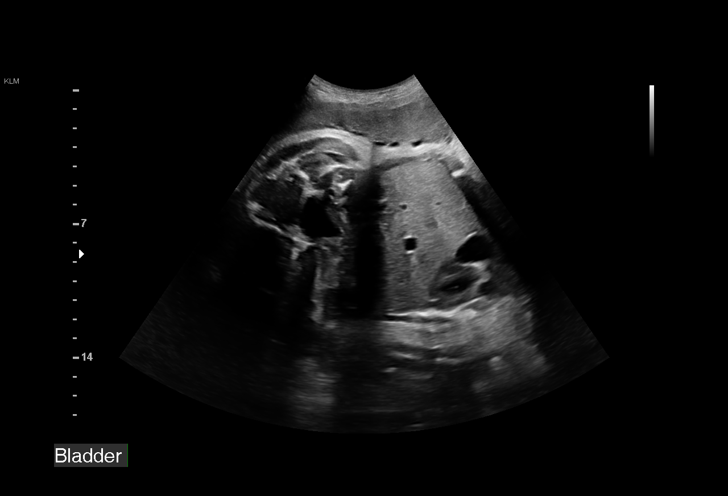
[im 5/18]
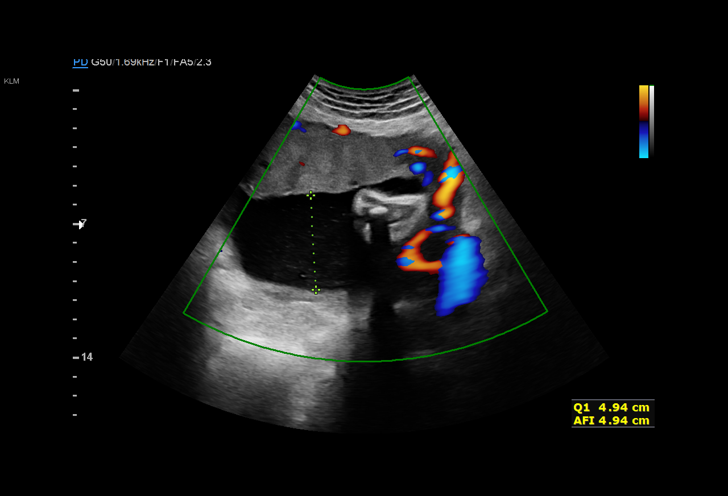
[im 6/18]
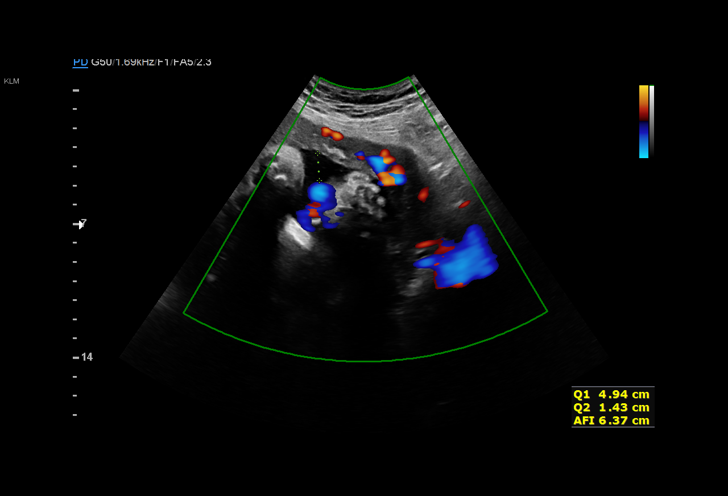
[im 7/18]
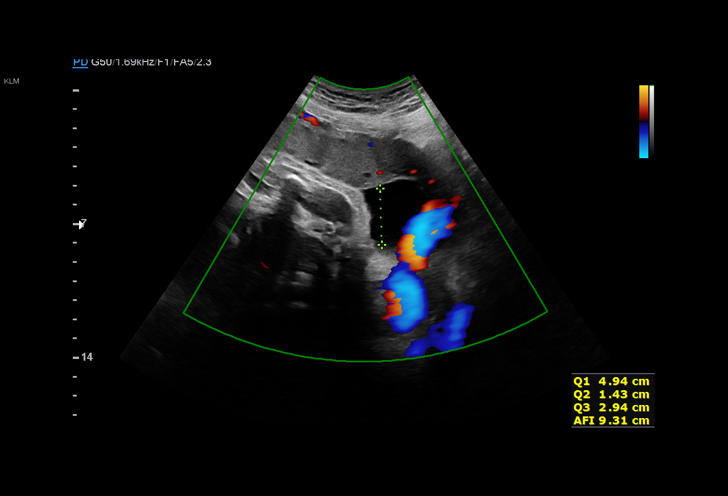
[im 8/18]
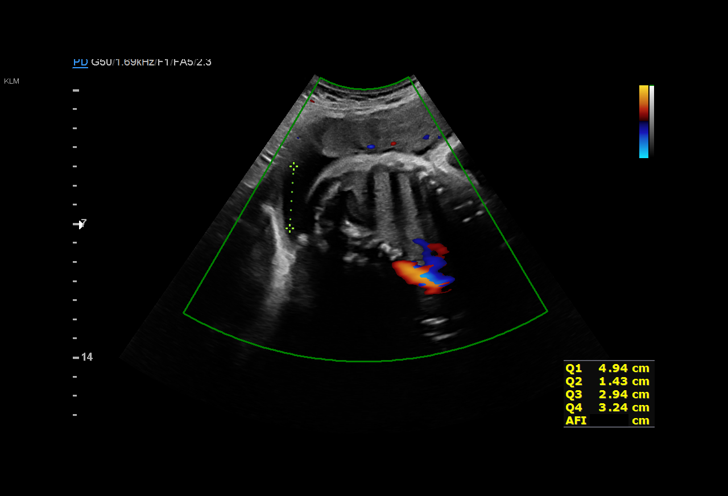
[im 10/18]
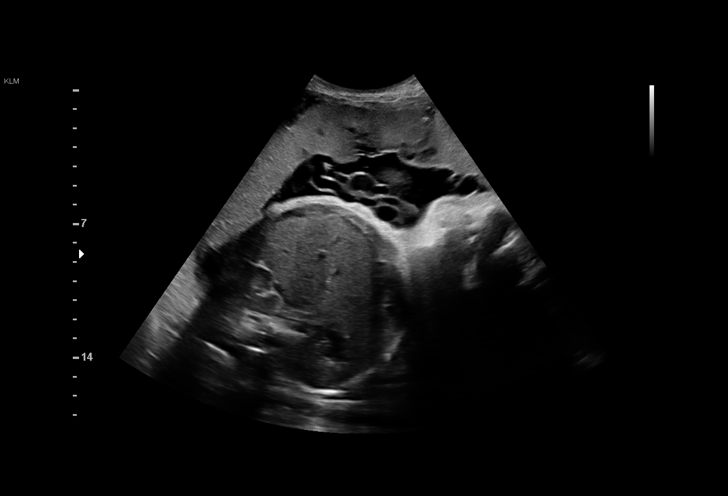
[im 11/18]
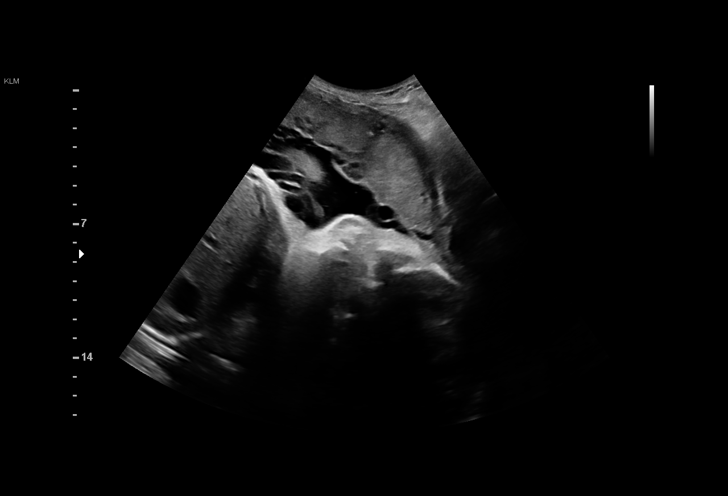
[im 12/18]
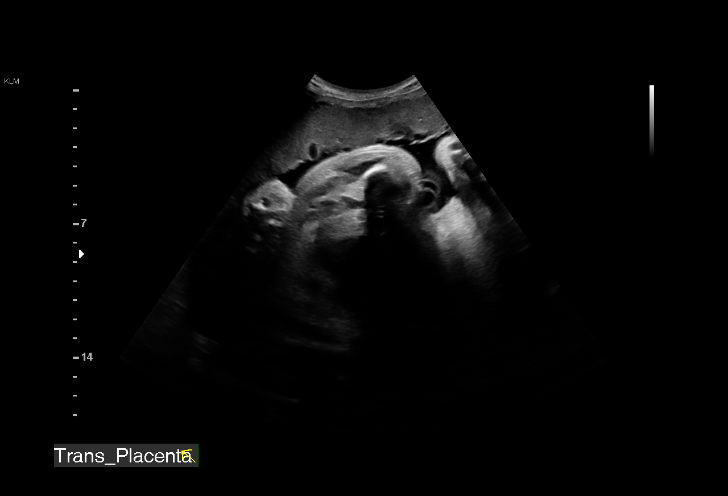
[im 13/18]
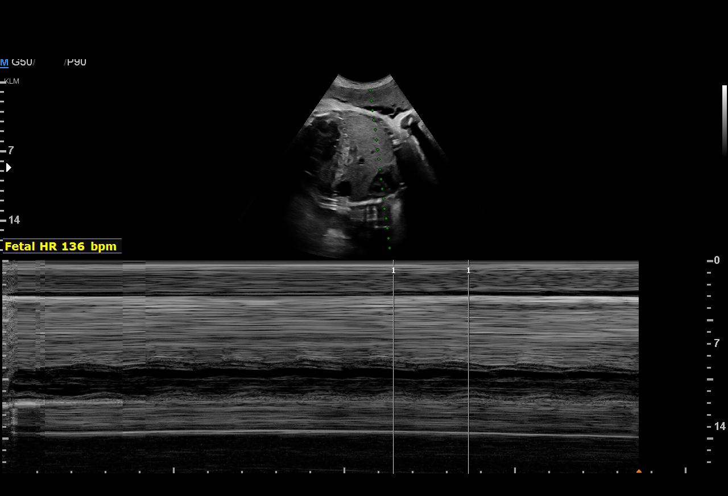
[im 14/18]
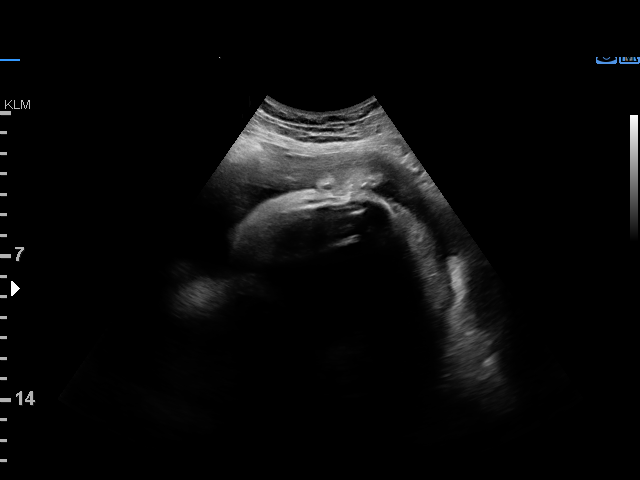
[im 16/18]
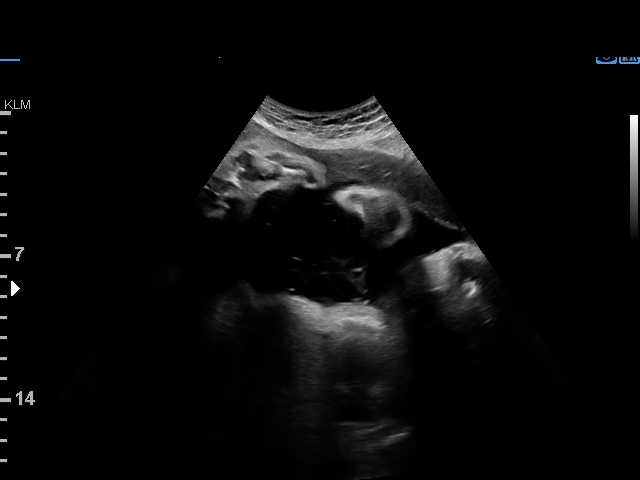
[im 17/18]
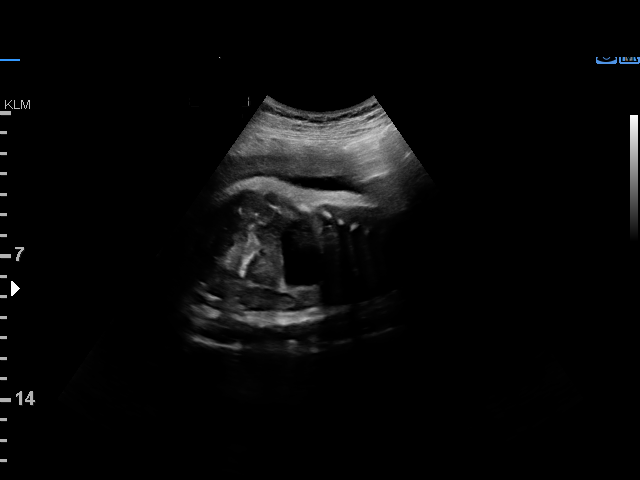
[im 18/18]
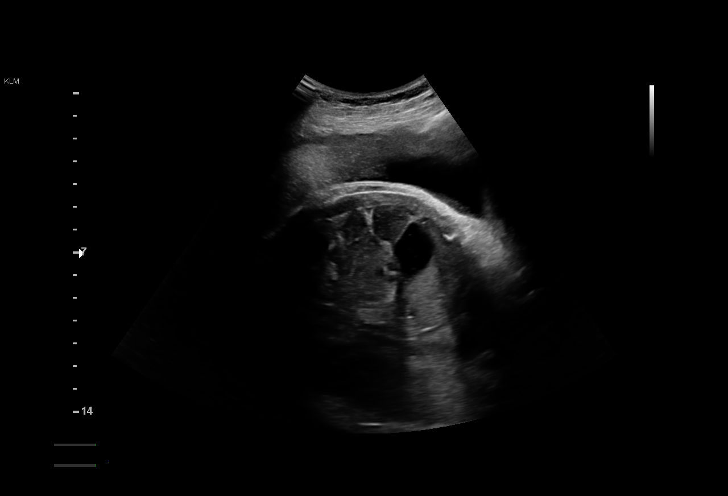

[15 of 18 positions shown; findings below may reference images not displayed]

CNM

 ----------------------------------------------------------------------

 ----------------------------------------------------------------------
Indications

  Maternal care for fetal tachycardia during
  pregnancy
  36 weeks gestation of pregnancy
  Gestational diabetes in pregnancy, diet
  controlled
  Previous cesarean delivery, antepartum
 ----------------------------------------------------------------------
Fetal Evaluation

 Num Of Fetuses:         1
 Fetal Heart Rate(bpm):  151
 Cardiac Activity:       Observed
 Presentation:           Cephalic
 Placenta:               Anterior

 Amniotic Fluid
 AFI FV:      Within normal limits

 AFI Sum(cm)     %Tile       Largest Pocket(cm)
 12.55           42

 RUQ(cm)       RLQ(cm)       LUQ(cm)        LLQ(cm)

Biophysical Evaluation

 Amniotic F.V:   Within normal limits       F. Tone:        Observed
 F. Movement:    Observed                   Score:          [DATE]
 F. Breathing:   Not Observed
Gestational Age
 LMP:           36w 5d        Date:  01/31/19                 EDD:   11/07/19
 Best:          36w 5d     Det. By:  LMP  (01/31/19)          EDD:   11/07/19
Anatomy

 Stomach:               Appears normal, left   Spine:                  Appears normal
                        sided
Impression

 BPP was requested because of fetal tachycardia at her office
 visit and it resolved.
 Patient has gestational diabetes that is reportedly well-
 controlled on diet.

 Amniotic fluid is normal and good fetal activity is seen. fetal
 Fetal breathing movements did not meet the criteria (BPP).
 BPP [DATE].
 Discussed with [facility] Team.
Recommendations

 -Patient has an appointment with MFM on 10/21/19.
                 Bhalla, G[place]ito

## 2020-10-06 IMAGING — US US MFM OB DETAIL+14 WK
1 series · 13 of 28 positions shown · non-contrast
Comparison: none

[Series 1: us mfm ob detail+14 wk · 94 acquisitions, 13 frames shown]
[im 4/94]
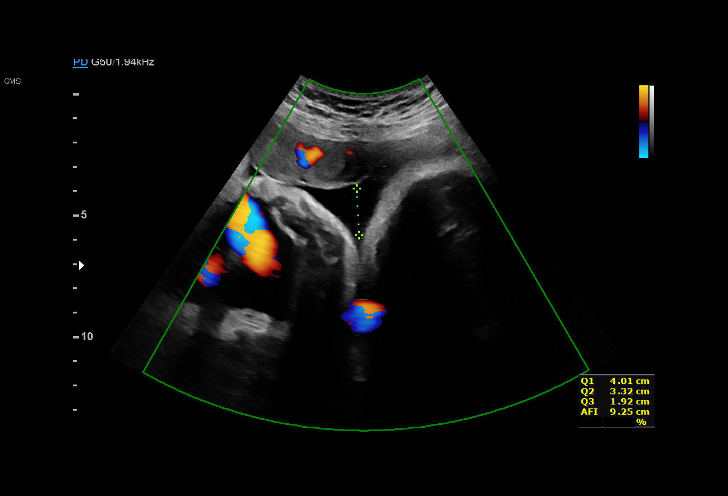
[im 11/94]
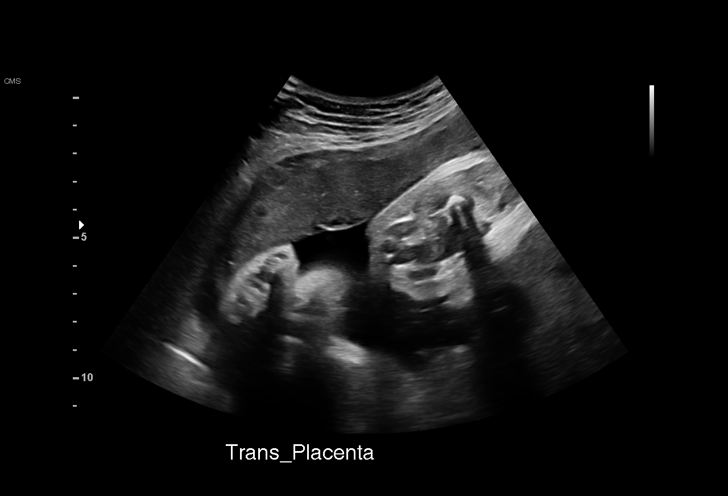
[im 18/94]
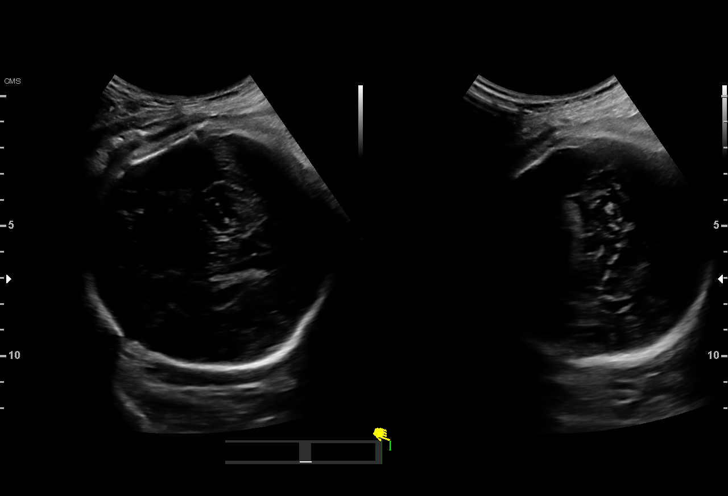
[im 25/94]
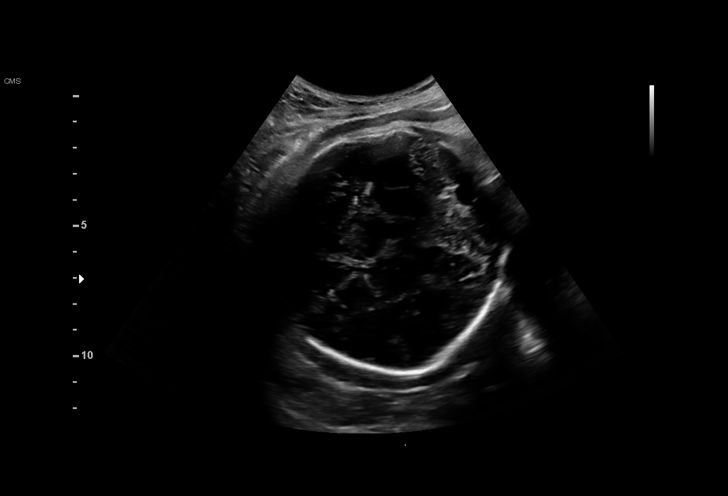
[im 32/94]
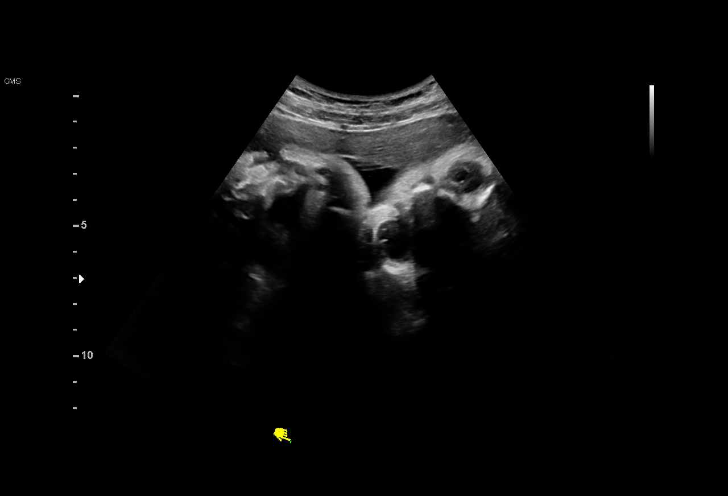
[im 38/94]
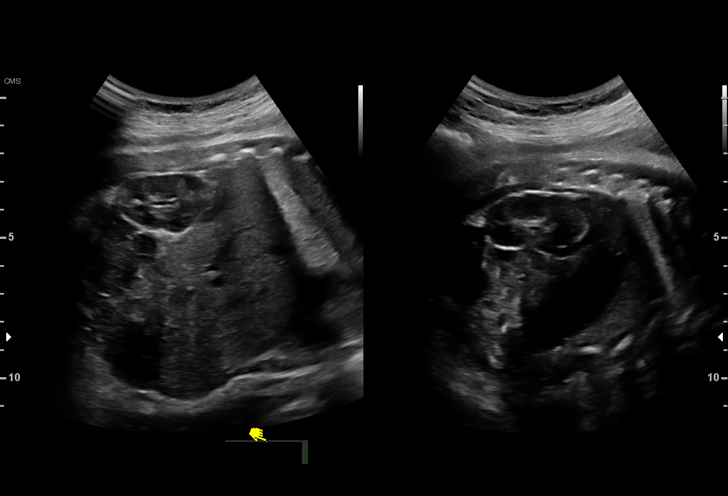
[im 49/94]
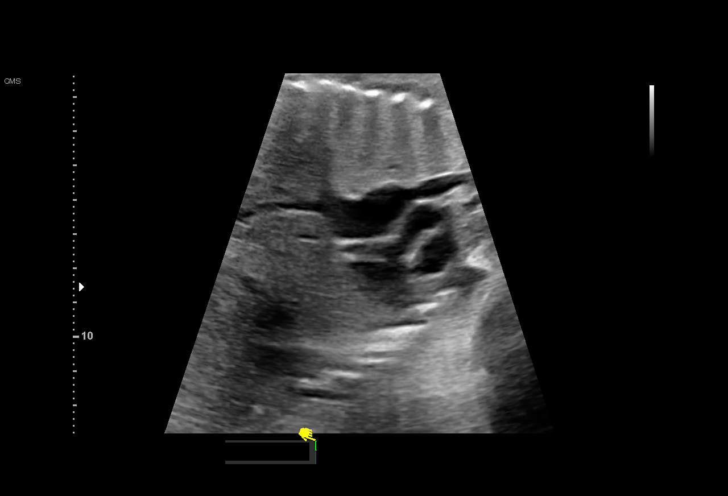
[im 56/94]
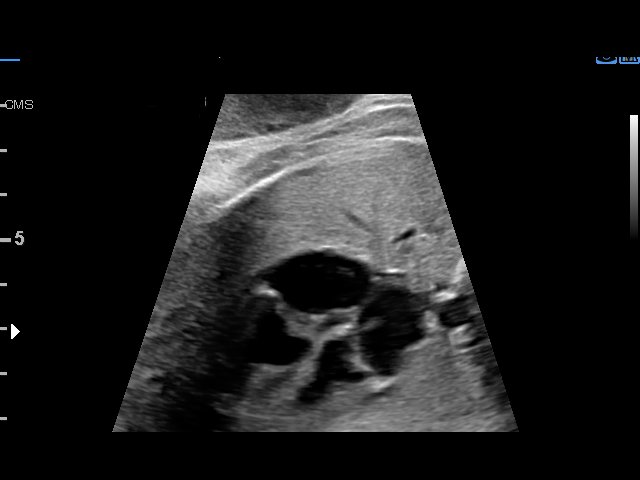
[im 63/94]
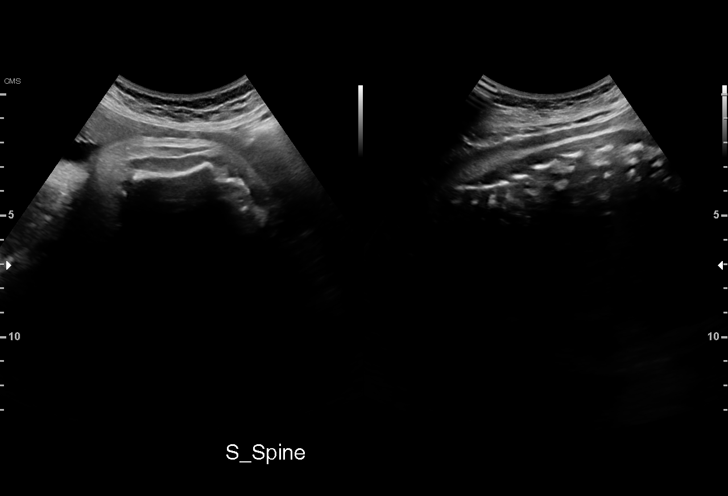
[im 69/94]
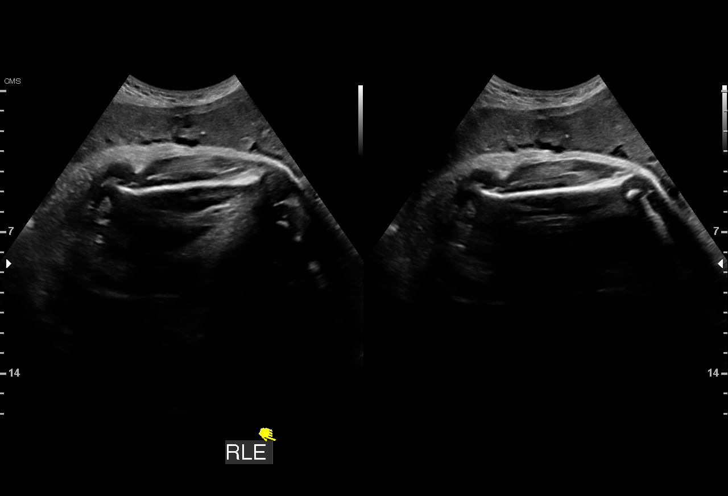
[im 76/94]
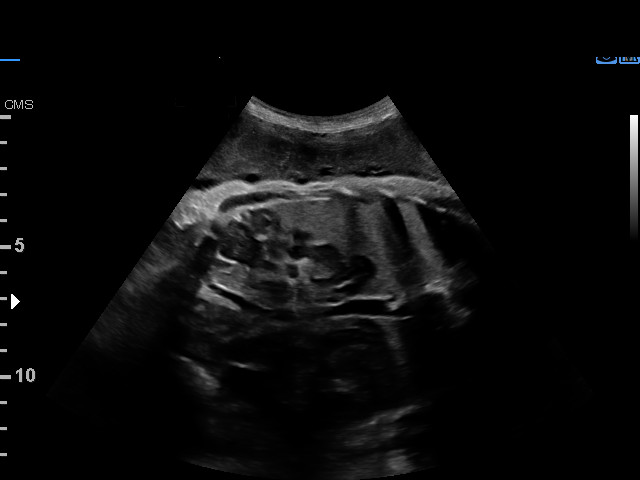
[im 83/94]
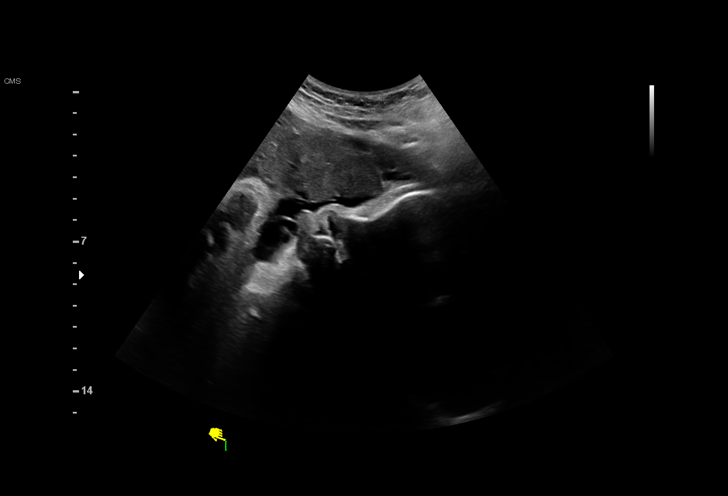
[im 90/94]
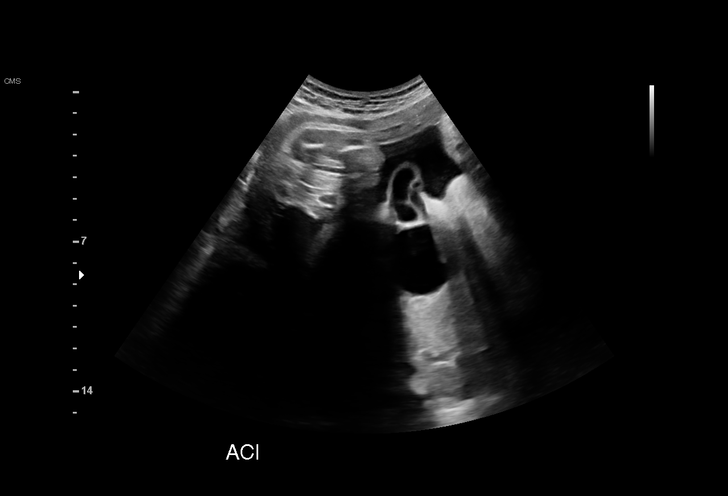

[13 of 28 positions shown; findings below may reference images not displayed]

CNM

 ----------------------------------------------------------------------

 ----------------------------------------------------------------------
Indications

  37 weeks gestation of pregnancy
  Gestational diabetes in pregnancy, diet
  controlled
  Previous cesarean delivery, antepartum
  Antenatal screening for malformations
 ----------------------------------------------------------------------
Fetal Evaluation

 Num Of Fetuses:         1
 Fetal Heart Rate(bpm):  158
 Cardiac Activity:       Observed
 Presentation:           Cephalic
 Placenta:               Anterior
 P. Cord Insertion:      Visualized

 Amniotic Fluid
 AFI FV:      Within normal limits

 AFI Sum(cm)     %Tile       Largest Pocket(cm)
 11.31           35

 RUQ(cm)       RLQ(cm)       LUQ(cm)        LLQ(cm)

Biophysical Evaluation

 Amniotic F.V:   Within normal limits       F. Tone:        Observed
 F. Movement:    Observed                   Score:          [DATE]
 F. Breathing:   Observed
Biometry

 BPD:      91.9  mm     G. Age:  37w 2d         63  %    CI:        74.79   %    70 - 86
                                                         FL/HC:      20.4   %    20.9 -
 HC:      337.2  mm     G. Age:  38w 5d         54  %    HC/AC:      0.98        0.92 -
 AC:      345.6  mm     G. Age:  38w 3d         86  %    FL/BPD:     75.0   %    71 - 87
 FL:       68.9  mm     G. Age:  35w 3d          7  %    FL/AC:      19.9   %    20 - 24
 HUM:      60.4  mm     G. Age:  35w 0d         26  %

 Est. FW:    5033  gm      7 lb 3 oz     62  %
Gestational Age

 LMP:           37w 4d        Date:  01/31/19                 EDD:   11/07/19
 U/S Today:     37w 3d                                        EDD:   11/08/19
 Best:          37w 4d     Det. By:  LMP  (01/31/19)          EDD:   11/07/19
Anatomy

 Cranium:               Appears normal         Aortic Arch:            Not well visualized
 Cavum:                 Appears normal         Ductal Arch:            Not well visualized
 Ventricles:            Appears normal         Diaphragm:              Appears normal
 Choroid Plexus:        Appears normal         Stomach:                Appears normal, left
                                                                       sided
 Cerebellum:            Appears normal         Abdomen:                Appears normal
 Posterior Fossa:       Appears normal         Abdominal Wall:         Appears nml (cord
                                                                       insert, abd wall)
 Nuchal Fold:           Not applicable (>20    Cord Vessels:           Appears normal (3
                        wks GA)                                        vessel cord)
 Face:                  Appears normal         Kidneys:                Appear normal
                        (orbits and profile)
 Lips:                  Appears normal         Bladder:                Appears normal
 Thoracic:              Appears normal         Spine:                  Appears normal
 Heart:                 Appears normal         Upper Extremities:      RUE visualized,
                        (4CH, axis, and                                LUE not well vis
                        situs)
 RVOT:                  Appears normal         Lower Extremities:      Visualized
 LVOT:                  Appears normal

 Other:  Feet visualized. Nasal bone visualized.
Cervix Uterus Adnexa

 Cervix
 Not visualized (advanced GA >62wks)

 Uterus
 No abnormality visualized.

 Left Ovary
 Within normal limits.

 Right Ovary
 Within normal limits.

 Cul De Sac
 No free fluid seen.

 Adnexa
 No abnormality visualized.
Impression

 Patient with gestational diabetes return for detailed
 ultrasound.
 Fetal growth is appropriate for gestational age.  Amniotic fluid
 is normal and good fetal activity seen.  Fetal anatomical
 survey appears normal but limited by advanced gestational
 age.  Cephalic presentation.  Antenatal testing is reassuring.
 BPP [DATE].
 Patient had previous cesarean delivery.  She elected to have
 repeat cesarean delivery.
Recommendations

 Follow-up scans as clinically indicated.
                 [place], Nrec
